# Patient Record
Sex: Male | Born: 1960 | Race: White | Hispanic: No | Marital: Married | Smoking: Never smoker
Health system: Southern US, Community
[De-identification: ages and names within clinical notes are randomized; demographics above are authoritative.]

## PROBLEM LIST (undated history)

## (undated) DIAGNOSIS — I517 Cardiomegaly: Secondary | ICD-10-CM

## (undated) DIAGNOSIS — E0590 Thyrotoxicosis, unspecified without thyrotoxic crisis or storm: Secondary | ICD-10-CM

## (undated) DIAGNOSIS — I422 Other hypertrophic cardiomyopathy: Secondary | ICD-10-CM

## (undated) DIAGNOSIS — I4819 Other persistent atrial fibrillation: Secondary | ICD-10-CM

## (undated) HISTORY — DX: Cardiomegaly: I51.7

## (undated) HISTORY — DX: Other hypertrophic cardiomyopathy: I42.2

## (undated) HISTORY — DX: Thyrotoxicosis, unspecified without thyrotoxic crisis or storm: E05.90

## (undated) HISTORY — DX: Other persistent atrial fibrillation: I48.19

---
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
## 2018-11-23 ENCOUNTER — Encounter (INDEPENDENT_AMBULATORY_CARE_PROVIDER_SITE_OTHER): Payer: Self-pay | Admitting: Ophthalmology

## 2018-11-23 ENCOUNTER — Ambulatory Visit (INDEPENDENT_AMBULATORY_CARE_PROVIDER_SITE_OTHER): Payer: Managed Care, Other (non HMO) | Admitting: Ophthalmology

## 2018-11-23 NOTE — Progress Notes (Signed)
[facility] - Clinic Note  11/23/2018     CHIEF COMPLAINT Patient presents for Eye Injury   HISTORY OF PRESENT ILLNESS: Ronald [name] is a 58 y.o. male who presents to the clinic today for:   HPI    Eye Injury    In left eye.  Type of trauma is direct.  Associated signs and symptoms include eye pain and redness.  Negative for vision loss, tearing, flashing lights, photophobia and blurred vision.  Pain was noted as 5/10.  Since onset it is stable.  I, the attending physician,  performed the HPI with the patient and updated documentation appropriately.          Comments    [facility]. Ptwas cutting shrubbery this morning and a limb poked left eye. Developed blood blister on lateral sclera. Reports mild eye pain, foreign body sensation. Denies vision change or blurry vision. Pt reports history of poor vision OS -- amblyopia.       Last edited by Rennis ChrisZamora, Alyissa Whidbee, MD on 11/23/2018 12:40 PM. (History)      Referring physician: No referring provider defined for this encounter.  HISTORICAL INFORMATION:   Selected notes from the MEDICAL RECORD NUMBER    CURRENT MEDICATIONS: No current outpatient medications on file. (Ophthalmic Drugs)   No current facility-administered medications for this visit.  (Ophthalmic Drugs)   No current outpatient medications on file. (Other)   No current facility-administered medications for this visit.  (Other)      REVIEW OF SYSTEMS: ROS    Negative for: Constitutional, Gastrointestinal, Neurological, Skin, Genitourinary, Musculoskeletal, HENT, Endocrine, Cardiovascular, Eyes, Respiratory, Psychiatric, Allergic/Imm, Heme/Lymph   Last edited by Rennis ChrisZamora, Elgie Maziarz, MD on 11/23/2018 12:26 PM. (History)       ALLERGIES Not on File  PAST MEDICAL HISTORY History reviewed. No pertinent past medical history. History reviewed. No pertinent surgical history.  FAMILY HISTORY History reviewed. No pertinent family  history.  SOCIAL HISTORY Social History   Tobacco Use  . Smoking status: Not on file  Substance Use Topics  . Alcohol use: Not on file  . Drug use: Not on file         OPHTHALMIC EXAM:  Base Eye Exam    Visual Acuity (Snellen - Linear)      Right Left   Dist cc 20/15 20/60 +1   Dist ph cc  20/50 +2   Correction: Glasses       Tonometry (Tonopen, 12:16 PM)      Right Left   Pressure 13 13       Pupils      Dark Light Shape React APD   Right 4 2 Round 2+ -   Left 4 2 Round 2+ -       Extraocular Movement      Right Left    Full, Ortho Full, Ortho       Neuro/Psych    Oriented x3: Yes   Mood/Affect: Normal       Dilation    Left eye: 1.0% Mydriacyl, 2.5% Phenylephrine @ 12:24 PM        Slit Lamp and Fundus Exam    Slit Lamp Exam      Right Left   Lids/Lashes Normal Normal   Conjunctiva/Sclera White and quiet subconj heme temporal   Cornea Clear Clear   Anterior Chamber Deep and quiet Deep and quiet   Iris Round and reactive Round and reactive   Lens 1+ NSC 1+ NSC  Vitreous Normal Normal       Fundus Exam      Right Left   Disc  compact, tilted   C/D Ratio  0.3   Macula  flat   Vessels  attenuated   Periphery  attached; no RT/RD          IMAGING AND PROCEDURES  Imaging and Procedures for 11/23/18           ASSESSMENT/PLAN:    ICD-10-CM   1. Subconjunctival hemorrhage of left eye  H11.32   2. Amblyopia of left eye  H53.002     1. [place] OS - trauma to OS while doing yard work, trimming shrubs -- limb poked OS under glasses - no debris in eye; no lacerations of conj or corneal abrasion - discussed findings, prognosis - no acute intervention indicated - advised ATs and lubricating ointment/gel as needed - f/u prn  2. Amblyopia OS - pt with long standing history of decreased - by history, likely refractive amblyopia   Ophthalmic Meds Ordered this visit:  No orders of the defined types were placed in this encounter.       Return if symptoms worsen or fail to improve.  There are no Patient Instructions on file for this visit.   Explained the diagnoses, plan, and follow up with the patient and they expressed understanding.  Patient expressed understanding of the importance of proper follow up care.   Gardiner Sleeper, M.D., Ph.D. Diseases & Surgery of the Retina and Vitreous [facility] 11/23/18     Abbreviations: M myopia (nearsighted); A astigmatism; H hyperopia (farsighted); P presbyopia; Mrx spectacle prescription;  CTL contact lenses; OD right eye; OS left eye; OU both eyes  XT exotropia; ET esotropia; PEK punctate epithelial keratitis; PEE punctate epithelial erosions; DES dry eye syndrome; MGD meibomian gland dysfunction; ATs artificial tears; PFAT's preservative free artificial tears; [place] nuclear sclerotic cataract; PSC posterior subcapsular cataract; ERM epi-retinal membrane; PVD posterior vitreous detachment; RD retinal detachment; DM diabetes mellitus; DR diabetic retinopathy; NPDR non-proliferative diabetic retinopathy; PDR proliferative diabetic retinopathy; CSME clinically significant macular edema; DME diabetic macular edema; dbh dot blot hemorrhages; CWS cotton wool spot; POAG primary open angle glaucoma; C/D cup-to-disc ratio; HVF humphrey visual field; GVF goldmann visual field; OCT optical coherence tomography; IOP intraocular pressure; BRVO Branch retinal vein occlusion; CRVO central retinal vein occlusion; CRAO central retinal artery occlusion; BRAO branch retinal artery occlusion; RT retinal tear; SB scleral buckle; PPV pars plana vitrectomy; VH Vitreous hemorrhage; PRP panretinal laser photocoagulation; IVK intravitreal kenalog; VMT vitreomacular traction; MH Macular hole;  NVD neovascularization of the disc; NVE neovascularization elsewhere; AREDS age related eye disease study; ARMD age related macular degeneration; POAG primary open angle glaucoma; EBMD  epithelial/anterior basement membrane dystrophy; ACIOL anterior chamber intraocular lens; IOL intraocular lens; PCIOL posterior chamber intraocular lens; Phaco/IOL phacoemulsification with intraocular lens placement; [place] photorefractive keratectomy; LASIK laser assisted in situ keratomileusis; HTN hypertension; DM diabetes mellitus; COPD chronic obstructive pulmonary disease

## 2019-03-27 ENCOUNTER — Encounter: Payer: Self-pay | Admitting: General Practice

## 2019-04-09 ENCOUNTER — Institutional Professional Consult (permissible substitution): Payer: Managed Care, Other (non HMO) | Admitting: Internal Medicine

## 2019-04-18 DIAGNOSIS — I4891 Unspecified atrial fibrillation: Secondary | ICD-10-CM | POA: Insufficient documentation

## 2019-04-22 ENCOUNTER — Ambulatory Visit (INDEPENDENT_AMBULATORY_CARE_PROVIDER_SITE_OTHER): Payer: Managed Care, Other (non HMO) | Admitting: Internal Medicine

## 2019-04-22 ENCOUNTER — Encounter: Payer: Self-pay | Admitting: Internal Medicine

## 2019-04-22 ENCOUNTER — Other Ambulatory Visit: Payer: Self-pay

## 2019-04-22 VITALS — BP 138/92 | HR 60 | Ht 74.0 in | Wt 218.6 lb

## 2019-04-22 DIAGNOSIS — I422 Other hypertrophic cardiomyopathy: Secondary | ICD-10-CM

## 2019-04-22 DIAGNOSIS — I4891 Unspecified atrial fibrillation: Secondary | ICD-10-CM

## 2019-04-22 NOTE — Progress Notes (Signed)
e   ELECTROPHYSIOLOGY CONSULT NOTE  Patient ID: Ronald [name], MRN: [id number], DOB/AGE: 10/08/1960 59 y.o. Admit date: (Not on file) Date of Consult: 04/22/2019  Primary Physician: System, Pcp Not In Primary Cardiologist: none -- [facility] Ronald [name] who is being seen today for the evaluation of Atrial fib at the request of jessica Livengood.   Chief Complaint: atrial fib   HPI Ronald [name] seen because of recurring atrial fibrillation.  Initially diagnosed with atrial fibrillation in 2012 in the setting of hyperthyroidism and underwent cardioversion 2013.  Recurrent atrial fibrillation 2019.  Treated with flecainide and converted spontaneously.  No recurrent atrial fibrillation January 2021 that lasted for couple of days and then terminated with self-administered elevated doses of flecainide.  Atrial fibrillation is associate with significant lassitude dyspnea with impaired exercise tolerance.  No lightheadedness.  Thromboembolic risk factors ( ) for a CHADSVASc Score of 0  Some sleep disordered breathing.  Strong family history of atrial fibrillation in his younger brother and sister, latter having undergone catheter ablation.  No family history of structural heart disease.  He has managed his Eliquis on his own taking it with recurrence of atrial fibrillation.  Otherwise takes high-dose aspirin on a regular basis  Outside office records reviewed   DATE TEST EF   6/19 (scanned)   Echo   64 % LAE (42 ml/ml2) LVH 15/ 9 mm          Date Cr K Hgb  1/21    15.3        \  Blood pressures as an outpatient in the 120/80 range    Past Medical History:  Diagnosis Date  . Asymmetric septal hypertrophy (HCC)   . Atrial fibrillation (HCC)   . Hyperthyroidism       Surgical History: History reviewed. No pertinent surgical history.   Home Meds:  Current Outpatient Medications:  .  aspirin 325 MG tablet, Take 1 tablet by  mouth daily., Disp: , Rfl:  .  flecainide (TAMBOCOR) 100 MG tablet, Take 100 mg by mouth 2 (two) times daily., Disp: , Rfl:  .  Omega-3 Fatty Acids (FISH OIL) 1000 MG CAPS, Take 1,200 mg by mouth daily., Disp: , Rfl:  .  Vitamins/Minerals TABS, Take 1 tablet by mouth daily., Disp: , Rfl:     Allergies: Not on File  Social History   Socioeconomic History  . Marital status: Married    Spouse name: Not on file  . Number of children: Not on file  . Years of education: Not on file  . Highest education level: Not on file  Occupational History  . Not on file  Tobacco Use  . Smoking status: Never Smoker  . Smokeless tobacco: Never Used  Substance and Sexual Activity  . Alcohol use: Not on file  . Drug use: Not on file  . Sexual activity: Not on file  Other Topics Concern  . Not on file  Social History Narrative  . Not on file   Social Determinants of Health   Financial Resource Strain:   . Difficulty of Paying Living Expenses: Not on file  Food Insecurity:   . Worried About Programme researcher, broadcasting/film/video in the Last Year: Not on file  . Ran Out of Food in the Last Year: Not on file  Transportation Needs:   . Lack of Transportation (Medical): Not on file  . Lack of Transportation (Non-Medical): Not on file  Physical Activity:   .  Days of Exercise per Week: Not on file  . Minutes of Exercise per Session: Not on file  Stress:   . Feeling of Stress : Not on file  Social Connections:   . Frequency of Communication with Friends and Family: Not on file  . Frequency of Social Gatherings with Friends and Family: Not on file  . Attends Religious Services: Not on file  . Active Member of Clubs or Organizations: Not on file  . Attends Archivist Meetings: Not on file  . Marital Status: Not on file  Intimate Partner Violence:   . Fear of Current or Ex-Partner: Not on file  . Emotionally Abused: Not on file  . Physically Abused: Not on file  . Sexually Abused: Not on file      History reviewed. No pertinent family history.   ROS:  Please see the history of present illness.     All other systems reviewed and negative.    Physical Exam: Blood pressure (!) 138/92, pulse 60, height 6\' 2"  (1.88 m), weight 218 lb 9.6 oz (99.2 kg), SpO2 98 %. General: Well developed, well nourished male in no acute distress. Head: Normocephalic, atraumatic, sclera non-icteric, no xanthomas, nares are without discharge. EENT: normal Lymph Nodes:  none Back: without scoliosis/kyphosis, no CVA tendersness Neck: Negative for carotid bruits. JVD not elevated. Lungs: Clear bilaterally to auscultation without wheezes, rales, or rhonchi. Breathing is unlabored. Heart: RRR with S1 S2. No  murmur , rubs, or gallops appreciated. Abdomen: Soft, non-tender, non-distended with normoactive bowel sounds. No hepatomegaly. No rebound/guarding. No obvious abdominal masses. Msk:  Strength and tone appear normal for age. Extremities: No clubbing or cyanosis. No edema.  Distal pedal pulses are 2+ and equal bilaterally. Skin: Warm and Dry Neuro: Alert and oriented X 3. CN III-XII intact Grossly normal sensory and motor function . Psych:  Responds to questions appropriately with a normal affect.      Labs: Cardiac Enzymes No results for input(s): CKTOTAL, CKMB, TROPONINI in the last 72 hours. CBC No results found for: WBC, HGB, HCT, MCV, PLT PROTIME: No results for input(s): LABPROT, INR in the last 72 hours. Chemistry No results for input(s): NA, K, CL, CO2, BUN, CREATININE, CALCIUM, PROT, BILITOT, ALKPHOS, ALT, AST, GLUCOSE in the last 168 hours.  Invalid input(s): LABALBU Lipids No results found for: CHOL, HDL, LDLCALC, TRIG BNP No results found for: PROBNP Thyroid Function Tests: No results for input(s): TSH, T4TOTAL, T3FREE, THYROIDAB in the last 72 hours.  Invalid input(s): FREET3    Miscellaneous No results found for: DDIMER  Radiology/Studies:  No results found.  EKG: Sinus at  60 Intervals 20/10/44   Assessment and Plan:  Atrial fibrillation-paroxysmal  Asymmetric septal hypertrophy?  HCM  Hyperthyroidism treated    The patient has atrial fibrillation and has infrequent for which he has been on flecainide for years.  He has tolerated it well.  He is taking aspirin for atrial fibrillation.  We have discussed the lack of utility for his thromboembolic risk reduction in atrial fibrillation.  We will stop it.  For now we will continue flecainide; however, his echo describes significant septal hypertrophy raising the possibility of hypertrophic cardiomyopathy.  In the event that he has significant hypertrophic heart disease, flecainide would be contraindicated.  Moreover, if this were the case, anticoagulation is indicated regardless of his CHA2DS2-VASc score  We have reviewed the above.  We have also reviewed the potential familial implications of HCM.  He has no children but he  has 2 siblings.  He has left atrial enlargement.  This would be expected if he has HCM.  He was obviously concerned that our review of the echo was different from his understanding that his echo was "normal ".    Sherryl Manges

## 2019-04-22 NOTE — Patient Instructions (Signed)
Medication Instructions:  Your physician recommends that you continue on your current medications as directed. Please refer to the Current Medication list given to you today.  *If you need a refill on your cardiac medications before your next appointment, please call your pharmacy*  Lab Work: None ordered.  If you have labs (blood work) drawn today and your tests are completely normal, you will receive your results only by: Marland Kitchen MyChart Message (if you have MyChart) OR . A paper copy in the mail If you have any lab test that is abnormal or we need to change your treatment, we will call you to review the results.  Testing/Procedures: Your physician has recommended that you have a sleep study. This test records several body functions during sleep, including: brain activity, eye movement, oxygen and carbon dioxide blood levels, heart rate and rhythm, breathing rate and rhythm, the flow of air through your mouth and nose, snoring, body muscle movements, and chest and belly movement.  Your physician has requested that you have a cardiac MRI. Cardiac MRI uses a computer to create images of your heart as its beating, producing both still and moving pictures of your heart and major blood vessels. For further information please visit [email or web address]. Please follow the instruction sheet given to you today for more information.    Follow-Up: At [facility], you and your health needs are our priority.  As part of our continuing mission to provide you with exceptional heart care, we have created designated Provider Care Teams.  These Care Teams include your primary Cardiologist (physician) and Advanced Practice Providers (APPs -  Physician Assistants and Nurse Practitioners) who all work together to provide you with the care you need, when you need it.  Your next appointment:   Your physician recommends that you schedule a follow-up appointment in:6 months with Dr Graciela Husbands   The format for your next  appointment:   In person  Provider:   Dr Sherryl Manges  Other Instructions  Nuclear Medicine Exam A nuclear medicine exam is a safe and painless imaging test. It helps your health care provider detect and diagnose diseases. It also provides information about the ways your organs work and how they are structured. For a nuclear medicine exam, you will be given a radioactive tracer. This substance is absorbed by your body's organs. A large scanning machine detects the tracer and creates pictures of the areas that your health care provider wants to know more about. There are several kinds of nuclear medicine exams. They include the following:  CT scan.  MRI scan.  PET scan.  SPECT scan. Tell your health care provider about:  Any allergies you have.  All medicines you are taking, including vitamins, herbs, eye drops, creams, and over-the-counter medicines.  Any problems you or family members have had with anesthetic medicines.  Any blood disorders you have.  Any surgeries you have had.  Any medical conditions you have.  Whether you are pregnant or may be pregnant.  Whether you are nursing. What are the risks? Generally, this is a safe procedure. However, problems may occur, such as an allergic reaction to the tracer, but this is rare. What happens before the procedure? Medicines Ask your health care provider about:  Changing or stopping your regular medicines. This is especially important if you are taking diabetes medicines or blood thinners.  Taking medicines such as aspirin and ibuprofen. These medicines can thin your blood. Do not take these medicines unless your health care provider tells you  to take them.  Taking over-the-counter medicines, vitamins, herbs, and supplements. General instructions  Follow instructions from your health care provider about eating or drinking restrictions.  Do not wear jewelry.  Wear loose, comfortable clothing. You may be asked to  wear a hospital gown for the procedure.  Bring previous imaging studies, such as X-rays, with you to the exam if they are available. What happens during the procedure?   An IV may be inserted into one of your veins.  You will be asked to lie on a table or sit in a chair.  You will be given the radioactive tracer. You may get: ? A pill or liquid to swallow. ? An injection. ? Medicine through your IV. ? A gas to inhale.  A large scanning machine will be used to create images of your body. After the pictures are taken, you may have to wait so your health care provider can make sure that enough images were taken. The procedure may vary among health care providers and hospitals. What happens after the procedure?  You may go home after the procedure and return to your usual activities, unless your health care provider tells you otherwise.  Drink enough water to keep your urine pale yellow. This helps to remove the radioactive tracer from your body.  It is up to you to get the results of your procedure. Ask your health care provider, or the department that is doing the procedure, when your results will be ready.  Get help right away if you have problems breathing. Summary  A nuclear medicine exam is a safe and painless imaging test that provides information about how your organs are working. It is also used to detect and diagnose diseases of various body organs.  Follow your health care provider's instructions about eating and drinking restrictions. Ask whether you should change or stop any medicines.  During the procedure, you will be given a radioactive tracer. A large scanning machine will create images of your body.  You may go home after the procedure and return to your regular activities. Follow your health care provider's instructions.  Get help right away if you have problems breathing. This information is not intended to replace advice given to you by your health care  provider. Make sure you discuss any questions you have with your health care provider. Document Revised: 01/09/2018 Document Reviewed: 01/09/2018 Elsevier Patient Education  2020 Elsevier Inc.   Sleep Studies A sleep study (polysomnogram) is a series of tests done while you are sleeping. A sleep study records your brain waves, heart rate, breathing rate, oxygen level, and eye and leg movements. A sleep study helps your health care provider:  See how well you sleep.  Diagnose a sleep disorder.  Determine how severe your sleep disorder is.  Create a plan to treat your sleep disorder. Your health care provider may recommend a sleep study if you:  Feel sleepy on most days.  Snore loudly while sleeping.  Have unusual behaviors while you sleep, such as walking.  Have brief periods in which you stop breathing during sleep (sleepapnea).  Fall asleep suddenly during the day (narcolepsy).  Have trouble falling asleep or staying asleep (insomnia).  Feel like you need to move your legs when trying to fall asleep (restless legs syndrome).  Move your legs by flexing and extending them regularly while asleep (periodic limb movement disorder).  Act out your dreams while you sleep (sleep behavior disorder).  Feel like you cannot move when you  first wake up (sleep paralysis). What tests are part of a sleep study? Most sleep studies record the following during sleep:  Brain activity.  Eye movements.  Heart rate and rhythm.  Breathing rate and rhythm.  Blood-oxygen level.  Blood pressure.  Chest and belly movement as you breathe.  Arm and leg movements.  Snoring or other noises.  Body position. Where are sleep studies done? Sleep studies are done at sleep centers. A sleep center may be inside a hospital, office, or clinic. The room where you have the study may look like a hospital room or a hotel room. The health care providers doing the study may come in and out of the room  during the study. Most of the time, they will be in another room monitoring your test as you sleep. How are sleep studies done? Most sleep studies are done during a normal period of time for a full night of sleep. You will arrive at the study center in the evening and go home in the morning. Before the test  Bring your pajamas and toothbrush with you to the sleep study.  Do not have caffeine on the day of your sleep study.  Do not drink alcohol on the day of your sleep study.  Your health care provider will let you know if you should stop taking any of your regular medicines before the test. During the test      Round, sticky patches with sensors attached to recording wires (electrodes) are placed on your scalp, face, chest, and limbs.  Wires from all the electrodes and sensors run from your bed to a computer. The wires can be taken off and put back on if you need to get out of bed to go to the bathroom.  A sensor is placed over your nose to measure airflow.  A finger clip is put on your finger or ear to measure your blood oxygen level (pulse oximetry).  A belt is placed around your belly and a belt is placed around your chest to measure breathing movements.  If you have signs of the sleep disorder called sleep apnea during your test, you may get a treatment mask to wear for the second half of the night. ? The mask provides positive airway pressure (PAP) to help you breathe better during sleep. This may greatly improve your sleep apnea. ? You will then have all tests done again with the mask in place to see if your measurements and recordings change. After the test  A medical doctor who specializes in sleep will evaluate the results of your sleep study and share them with you and your primary health care provider.  Based on your results, your medical history, and a physical exam, you may be diagnosed with a sleep disorder, such as: ? Sleep apnea. ? Restless legs  syndrome. ? Sleep-related behavior disorder. ? Sleep-related movement disorders. ? Sleep-related seizure disorders.  Your health care team will help determine your treatment options based on your diagnosis. This may include: ? Improving your sleep habits (sleep hygiene). ? Wearing a continuous positive airway pressure (CPAP) or bi-level positive airway pressure (BPAP) mask. ? Wearing an oral device at night to improve breathing and reduce snoring. ? Taking medicines. Follow these instructions at home:  Take over-the-counter and prescription medicines only as told by your health care provider.  If you are instructed to use a CPAP or BPAP mask, make sure you use it nightly as directed.  Make any lifestyle changes that  your health care provider recommends.  If you were given a device to open your airway while you sleep, use it only as told by your health care provider.  Do not use any tobacco products, such as cigarettes, chewing tobacco, and e-cigarettes. If you need help quitting, ask your health care provider.  Keep all follow-up visits as told by your health care provider. This is important. Summary  A sleep study (polysomnogram) is a series of tests done while you are sleeping. It shows how well you sleep.  Most sleep studies are done over one full night of sleep. You will arrive at the study center in the evening and go home in the morning.  If you have signs of the sleep disorder called sleep apnea during your test, you may get a treatment mask to wear for the second half of the night.  A medical doctor who specializes in sleep will evaluate the results of your sleep study and share them with your primary health care provider. This information is not intended to replace advice given to you by your health care provider. Make sure you discuss any questions you have with your health care provider. Document Revised: 08/07/2018 Document Reviewed: 03/20/2017 Elsevier Patient Education   [place].

## 2019-04-28 ENCOUNTER — Telehealth: Payer: Self-pay | Admitting: *Deleted

## 2019-04-28 NOTE — Telephone Encounter (Signed)
Staff message sent to Ronald [name] in lab sleep study denied by [place]. Patient does not meet the criteria for in lab study. Authorized HST. Auth # [id number]. Valid dates 04/28/19 to 07/27/19.

## 2019-04-29 ENCOUNTER — Telehealth: Payer: Self-pay | Admitting: *Deleted

## 2019-04-29 NOTE — Telephone Encounter (Signed)
-----   Message from Gaynelle Cage, CMA sent at 04/28/2019 11:24 AM EST ----- Regarding: RE: Sleep study [employer] denied in lab studies. Does not meet criteria. Approved HST. Auth # [id number]. Valid dates 04/28/19 to 07/27/19. ----- Message ----- From: Alois Cliche, RN Sent: 04/22/2019   4:38 PM EST To: [facility] Sleep Studies Subject: Sleep study                                    Please precert and schedule pt for sleep study  Thank you,  Mindi Junker

## 2019-05-05 ENCOUNTER — Institutional Professional Consult (permissible substitution): Payer: Managed Care, Other (non HMO) | Admitting: Internal Medicine

## 2019-05-09 ENCOUNTER — Ambulatory Visit: Payer: Managed Care, Other (non HMO) | Attending: Internal Medicine

## 2019-05-09 DIAGNOSIS — Z23 Encounter for immunization: Secondary | ICD-10-CM

## 2019-05-09 NOTE — Progress Notes (Signed)
   Covid-19 Vaccination Clinic  Name:  Obadiah Dennard    MRN: [id number] DOB: 10-15-1960  05/09/2019  Mr. Skillman was observed post Covid-19 immunization for 15 minutes without incident. He was provided with Vaccine Information Sheet and instruction to access the V-Safe system.   Mr. Pitsenbarger was instructed to call 911 with any severe reactions post vaccine: Marland Kitchen Difficulty breathing  . Swelling of face and throat  . A fast heartbeat  . A bad rash all over body  . Dizziness and weakness

## 2019-05-10 ENCOUNTER — Other Ambulatory Visit (HOSPITAL_COMMUNITY): Payer: Managed Care, Other (non HMO)

## 2019-05-12 NOTE — Telephone Encounter (Signed)
Patient called to cancel on 05/02/19.

## 2019-05-13 ENCOUNTER — Encounter (HOSPITAL_BASED_OUTPATIENT_CLINIC_OR_DEPARTMENT_OTHER): Payer: Managed Care, Other (non HMO) | Admitting: Cardiology

## 2019-05-15 ENCOUNTER — Encounter: Payer: Self-pay | Admitting: Internal Medicine

## 2019-05-15 ENCOUNTER — Telehealth: Payer: Self-pay | Admitting: Internal Medicine

## 2019-05-15 NOTE — Telephone Encounter (Signed)
Left message regarding appointment for Cardiac MRI scheduled Friday 06/06/19 at 9: 00 am at [facility]---arrival time is 8:15 am 1st floor radiology department.  Will mail informatoin to patient.

## 2019-06-04 ENCOUNTER — Ambulatory Visit: Payer: Managed Care, Other (non HMO) | Attending: Internal Medicine

## 2019-06-04 DIAGNOSIS — Z23 Encounter for immunization: Secondary | ICD-10-CM

## 2019-06-04 NOTE — Progress Notes (Signed)
   Covid-19 Vaccination Clinic  Name:  Ronald [name]    MRN: [id number] DOB: [birth year]  06/04/2019  [name] was observed post Covid-19 immunization for 15 minutes without incident. He was provided with Vaccine Information Sheet and instruction to access the V-Safe system.   [name] was instructed to call 911 with any severe reactions post vaccine: Marland Kitchen Difficulty breathing  . Swelling of face and throat  . A fast heartbeat  . A bad rash all over body  . Dizziness and weakness   Immunizations Administered    Name Date Dose VIS Date Route   Pfizer COVID-19 Vaccine 06/04/2019 12:40 PM 0.3 mL 02/14/2019 Intramuscular   Manufacturer: [employer], [employer]   Lot: [id number]   NDC: [address]-3

## 2019-06-06 ENCOUNTER — Ambulatory Visit (HOSPITAL_COMMUNITY)
Admission: RE | Admit: 2019-06-06 | Discharge: 2019-06-06 | Disposition: A | Discharge status: Home / Self Care | Payer: Managed Care, Other (non HMO) | Source: Ambulatory Visit | Attending: Internal Medicine | Admitting: Internal Medicine

## 2019-06-06 ENCOUNTER — Other Ambulatory Visit: Payer: Self-pay

## 2019-06-06 DIAGNOSIS — I4891 Unspecified atrial fibrillation: Secondary | ICD-10-CM | POA: Diagnosis not present

## 2019-06-06 DIAGNOSIS — I422 Other hypertrophic cardiomyopathy: Secondary | ICD-10-CM | POA: Diagnosis not present

## 2019-06-06 MED ORDER — GADOBUTROL 1 MMOL/ML IV SOLN
10.0000 mL | Freq: Once | INTRAVENOUS | Status: AC | PRN
  Administered 2019-06-06: 11:00:00 10 mL via INTRAVENOUS

## 2019-06-19 ENCOUNTER — Telehealth: Payer: Self-pay

## 2019-06-19 DIAGNOSIS — I4891 Unspecified atrial fibrillation: Secondary | ICD-10-CM

## 2019-06-19 DIAGNOSIS — I422 Other hypertrophic cardiomyopathy: Secondary | ICD-10-CM

## 2019-06-19 NOTE — Telephone Encounter (Signed)
-----   Message from Shona Simpson, RN sent at 06/18/2019  4:20 PM EDT ----- See Dr. Odessa Fleming result note - does not appear pt has been contacted for results - once pt has been notified of results let the afib clinic know and we will be happy to schedule follow up to start anticoagulation. Thanks! Stacy RN AFib Clinic

## 2019-06-19 NOTE — Telephone Encounter (Signed)
Phone call to pt and reviewed results with pt. (See also result note)  Advised per Dr Graciela Husbands he will need to stop ASA and Flecainide.  Referred to AFIB clinic to start anticoag therapy.  Discussed possibility of ablation with pt.  Pt verbalizes understanding and is agreeable with current plan.

## 2019-06-20 ENCOUNTER — Other Ambulatory Visit: Payer: Self-pay

## 2019-06-20 ENCOUNTER — Ambulatory Visit (HOSPITAL_COMMUNITY)
Admission: RE | Admit: 2019-06-20 | Discharge: 2019-06-20 | Disposition: A | Discharge status: Home / Self Care | Payer: Managed Care, Other (non HMO) | Source: Ambulatory Visit | Attending: Physician Assistant | Admitting: Physician Assistant

## 2019-06-20 ENCOUNTER — Encounter (HOSPITAL_COMMUNITY): Payer: Self-pay | Admitting: Physician Assistant

## 2019-06-20 VITALS — BP 146/82 | HR 68 | Ht 74.0 in | Wt 217.2 lb

## 2019-06-20 DIAGNOSIS — D6869 Other thrombophilia: Secondary | ICD-10-CM | POA: Diagnosis not present

## 2019-06-20 DIAGNOSIS — Z881 Allergy status to other antibiotic agents status: Secondary | ICD-10-CM | POA: Insufficient documentation

## 2019-06-20 DIAGNOSIS — I4891 Unspecified atrial fibrillation: Secondary | ICD-10-CM | POA: Diagnosis present

## 2019-06-20 DIAGNOSIS — I422 Other hypertrophic cardiomyopathy: Secondary | ICD-10-CM | POA: Diagnosis not present

## 2019-06-20 DIAGNOSIS — I480 Paroxysmal atrial fibrillation: Secondary | ICD-10-CM

## 2019-06-20 LAB — BASIC METABOLIC PANEL
Anion gap: 8 (ref 5–15)
BUN: 13 mg/dL (ref 6–20)
CO2: 28 mmol/L (ref 22–32)
Calcium: 9.5 mg/dL (ref 8.9–10.3)
Chloride: 102 mmol/L (ref 98–111)
Creatinine, Ser: 1 mg/dL (ref 0.61–1.24)
GFR calc Af Amer: >60 mL/min (ref >60)
GFR calc non Af Amer: >60 mL/min (ref >60)
Glucose, Bld: 133 mg/dL — ABNORMAL HIGH (ref 70–99)
Potassium: 4 mmol/L (ref 3.5–5.1)
Sodium: 138 mmol/L (ref 135–145)

## 2019-06-20 LAB — CBC
HCT: 47 % (ref 39.0–52.0)
Hemoglobin: 15.9 g/dL (ref 13.0–17.0)
MCH: 30.8 pg (ref 26.0–34.0)
MCHC: 33.8 g/dL (ref 30.0–36.0)
MCV: 90.9 fL (ref 80.0–100.0)
Platelets: 196 10*3/uL (ref 150–400)
RBC: 5.17 MIL/uL (ref 4.22–5.81)
RDW: 12.3 % (ref 11.5–15.5)
WBC: 3.3 10*3/uL — ABNORMAL LOW (ref 4.0–10.5)
nRBC: 0 % (ref 0.0–0.2)

## 2019-06-20 MED ORDER — APIXABAN 5 MG PO TABS: 5.0000 mg | ORAL_TABLET | Freq: Two times a day (BID) | ORAL | 3 refills | Status: DC

## 2019-06-20 NOTE — Progress Notes (Signed)
Primary Care Physician: System, Pcp Not In Primary Electrophysiologist: Dr Graciela Husbands Referring Physician: Dr Rowe Pavy is a 59 y.o. male with a history of paroxysmal atrial fibrillation and HCM who presents for follow up in the [facility]. The patient was initially diagnosed with atrial fibrillation in 2012 in the setting of hyperthyroidism and underwent DCCV in 2013. Patient is not on anticoagulation for a CHADS2VASC score of 0. He has used flecainide with good success but this was d/c'd with his diagnosis of HCM. Patient reports he feels very well overall and has been very active for many years. He is anxious about his new diagnosis. His mother died suddenly from unknown causes. He denies significant alcohol use but does admit to snoring and apnea. His father, sister, and brother all have had a diagnosis of afib.   Today, he denies symptoms of palpitations, chest pain, shortness of breath, orthopnea, PND, lower extremity edema, dizziness, presyncope, syncope, snoring, daytime somnolence, bleeding, or neurologic sequela. The patient is tolerating medications without difficulties and is otherwise without complaint today.    Atrial Fibrillation Risk Factors:  he does have symptoms or diagnosis of sleep apnea. Sleep study pending. he does not have a history of rheumatic fever. he does not have a history of alcohol use. The patient does have a history of early familial atrial fibrillation or other arrhythmias.  he has a BMI of Body mass index is 27.89 kg/m.Marland Kitchen Filed Weights   06/20/19 0956  Weight: 98.5 kg    No family history on file.   Atrial Fibrillation Management history:  Previous antiarrhythmic drugs: flecainide Previous cardioversions: 2013 Previous ablations: none CHADS2VASC score: 0 Anticoagulation history: none   Past Medical History:  Diagnosis Date  . Asymmetric septal hypertrophy (HCC)   . Atrial fibrillation (HCC)   .  Hyperthyroidism    No past surgical history on file.  Current Outpatient Medications  Medication Sig Dispense Refill  . Omega-3 Fatty Acids (FISH OIL) 1000 MG CAPS Take 1,200 mg by mouth daily.    . Vitamins/Minerals TABS Take 1 tablet by mouth daily.    Marland Kitchen apixaban (ELIQUIS) 5 MG TABS tablet Take 1 tablet (5 mg total) by mouth 2 (two) times daily. 60 tablet 3   No current facility-administered medications for this encounter.    Allergies  Allergen Reactions  . Ciprofloxacin Other (See Comments)    dizziness    Social History   Socioeconomic History  . Marital status: Married    Spouse name: Not on file  . Number of children: Not on file  . Years of education: Not on file  . Highest education level: Not on file  Occupational History  . Not on file  Tobacco Use  . Smoking status: Never Smoker  . Smokeless tobacco: Never Used  Substance and Sexual Activity  . Alcohol use: Not on file  . Drug use: Never  . Sexual activity: Not on file  Other Topics Concern  . Not on file  Social History Narrative  . Not on file   Social Determinants of Health   Financial Resource Strain:   . Difficulty of Paying Living Expenses:   Food Insecurity:   . Worried About Programme researcher, broadcasting/film/video in the Last Year:   . Barista in the Last Year:   Transportation Needs:   . Freight forwarder (Medical):   Marland Kitchen Lack of Transportation (Non-Medical):   Physical Activity:   . Days of  Exercise per Week:   . Minutes of Exercise per Session:   Stress:   . Feeling of Stress :   Social Connections:   . Frequency of Communication with Friends and Family:   . Frequency of Social Gatherings with Friends and Family:   . Attends Religious Services:   . Active Member of Clubs or Organizations:   . Attends Archivist Meetings:   Marland Kitchen Marital Status:   Intimate Partner Violence:   . Fear of Current or Ex-Partner:   . Emotionally Abused:   Marland Kitchen Physically Abused:   . Sexually Abused:       ROS- All systems are reviewed and negative except as per the HPI above.  Physical Exam: Vitals:   06/20/19 0956  BP: (!) 146/82  Pulse: 68  Weight: 98.5 kg  Height: 6\' 2"  (1.88 m)    GEN- The patient is well appearing, alert and oriented x 3 today.   Head- normocephalic, atraumatic Eyes-  Sclera clear, conjunctiva pink Ears- hearing intact Oropharynx- clear Neck- supple  Lungs- Clear to ausculation bilaterally, normal work of breathing Heart- Regular rate and rhythm, no murmurs, rubs or gallops  GI- soft, NT, ND, + BS Extremities- no clubbing, cyanosis, or edema MS- no significant deformity or atrophy Skin- no rash or lesion Psych- euthymic mood, full affect Neuro- strength and sensation are intact  Wt Readings from Last 3 Encounters:  06/20/19 98.5 kg  04/22/19 99.2 kg    EKG today demonstrates SR HR 68, PR 182, QRS 88, QTc 431  Cardiac MRI 06/06/19 IMPRESSION: 1. Mild asymmetric hypertrophy in basal anteroseptum measuring 10mm (72mm in posterior wall), not meeting criteria for HCM (<15 mm). However, there is focal moderate hypertrophy in the mid inferoseptum measuring up to 70mm, which does meet criteria for HCM  2. RV insertion site LGE, which can be seen in HCM. LGE accounts for <1% of total myocardial mass  3.  Mild LV dilatation with normal systolic function (EF 14%)  4.  Normal RV size and systolic function (EF 43%)  Epic records are reviewed at length today  CHA2DS2-VASc Score = 0 The patient's score is based upon: CHF History: No HTN History: No Age : < 65 Diabetes History: No Stroke History: No Vascular Disease History: No Gender: Male      ASSESSMENT AND PLAN: 1. Paroxysmal Atrial Fibrillation (ICD10:  I48.0) The patient's CHA2DS2-VASc score is 0   General education about afib provided and questions answered. We also discussed his stroke risk in the setting of HCM and the risks and benefits of anticoagulation.  Start Eliquis 5 mg BID.  Check Bmet/CBC today. Will not plan for AAD for now. If his afib becomes more persistent, can consider ablation vs alternate AAD. Patient in agreement with plan.  2. Secondary hypercoagulable state Patient has a CHADS2VASC score of 0, however, with his h/o HCM anticoagulation is indicated.   3. Suspected obstructive sleep apnea The importance of adequate treatment of sleep apnea was discussed today in order to improve our ability to maintain sinus rhythm long term. Sleep study pending.  4. HCM Mild basal and moderate mid interseptum hypertrophy on cardiac MRI. Normal LV systolic function.     Follow up with Dr Caryl Comes in one month.    [facility] [address] [place], [place] [address] [phone] 06/20/2019 11:15 AM

## 2019-06-20 NOTE — Patient Instructions (Signed)
Start Eliquis 5mg  twice a day  Scheduling will contact you for appointment with Dr. in 1 month

## 2019-07-01 ENCOUNTER — Other Ambulatory Visit: Payer: Self-pay

## 2019-07-01 ENCOUNTER — Encounter (HOSPITAL_COMMUNITY): Payer: Self-pay | Admitting: Physician Assistant

## 2019-07-01 ENCOUNTER — Ambulatory Visit (HOSPITAL_COMMUNITY)
Admission: RE | Admit: 2019-07-01 | Discharge: 2019-07-01 | Disposition: A | Discharge status: Home / Self Care | Payer: Managed Care, Other (non HMO) | Source: Ambulatory Visit | Attending: Physician Assistant | Admitting: Physician Assistant

## 2019-07-01 VITALS — BP 122/84 | HR 109 | Ht 74.0 in | Wt 215.8 lb

## 2019-07-01 DIAGNOSIS — I422 Other hypertrophic cardiomyopathy: Secondary | ICD-10-CM | POA: Diagnosis not present

## 2019-07-01 DIAGNOSIS — D6869 Other thrombophilia: Secondary | ICD-10-CM | POA: Diagnosis not present

## 2019-07-01 DIAGNOSIS — R0681 Apnea, not elsewhere classified: Secondary | ICD-10-CM | POA: Diagnosis not present

## 2019-07-01 DIAGNOSIS — Z7901 Long term (current) use of anticoagulants: Secondary | ICD-10-CM | POA: Insufficient documentation

## 2019-07-01 DIAGNOSIS — Z79899 Other long term (current) drug therapy: Secondary | ICD-10-CM | POA: Diagnosis not present

## 2019-07-01 DIAGNOSIS — Z881 Allergy status to other antibiotic agents status: Secondary | ICD-10-CM | POA: Insufficient documentation

## 2019-07-01 DIAGNOSIS — I4891 Unspecified atrial fibrillation: Secondary | ICD-10-CM | POA: Diagnosis present

## 2019-07-01 DIAGNOSIS — I480 Paroxysmal atrial fibrillation: Secondary | ICD-10-CM | POA: Diagnosis not present

## 2019-07-01 DIAGNOSIS — R0683 Snoring: Secondary | ICD-10-CM | POA: Insufficient documentation

## 2019-07-01 MED ORDER — DILTIAZEM HCL ER COATED BEADS 120 MG PO CP24: 120.0000 mg | ORAL_CAPSULE | Freq: Every day | ORAL | 3 refills | Status: DC

## 2019-07-01 NOTE — Progress Notes (Signed)
Primary Care Physician: System, Pcp Not In Primary Electrophysiologist: Dr Graciela Husbands Referring Physician: Dr Rowe Pavy is a 59 y.o. male with a history of paroxysmal atrial fibrillation and HCM who presents for follow up in the [facility]. The patient was initially diagnosed with atrial fibrillation in 2012 in the setting of hyperthyroidism and underwent DCCV in 2013. Patient is not on anticoagulation for a CHADS2VASC score of 0. He has used flecainide with good success but this was d/c'd with his diagnosis of HCM. Patient reports he feels very well overall and has been very active for many years. He is anxious about his new diagnosis. His mother died suddenly from unknown causes. He denies significant alcohol use but does admit to snoring and apnea. His father, sister, and brother all have had a diagnosis of afib.   On follow up today, patient reports he felt he was back in afib the evening of 06/30/19. There were no triggers he could identify. He has symptoms of fatigue and palpitations. He is tolerating anticoagulation without and bleeding issues.   Today, he denies symptoms of chest pain, shortness of breath, orthopnea, PND, lower extremity edema, dizziness, presyncope, syncope, bleeding, or neurologic sequela. The patient is tolerating medications without difficulties and is otherwise without complaint today.    Atrial Fibrillation Risk Factors:  he does have symptoms or diagnosis of sleep apnea. Sleep study pending. he does not have a history of rheumatic fever. he does not have a history of alcohol use. The patient does have a history of early familial atrial fibrillation or other arrhythmias.  he has a BMI of Body mass index is 27.71 kg/m.Marland Kitchen Filed Weights   07/01/19 1449  Weight: 97.9 kg    No family history on file.   Atrial Fibrillation Management history:  Previous antiarrhythmic drugs: flecainide Previous cardioversions:  2013 Previous ablations: none CHADS2VASC score: 0 Anticoagulation history: Eliquis   Past Medical History:  Diagnosis Date  . Asymmetric septal hypertrophy (HCC)   . Atrial fibrillation (HCC)   . Hyperthyroidism    No past surgical history on file.  Current Outpatient Medications  Medication Sig Dispense Refill  . apixaban (ELIQUIS) 5 MG TABS tablet Take 1 tablet (5 mg total) by mouth 2 (two) times daily. 60 tablet 3  . Omega-3 Fatty Acids (FISH OIL) 1000 MG CAPS Take 1,200 mg by mouth daily.    . Vitamins/Minerals TABS Take 1 tablet by mouth daily.    Marland Kitchen diltiazem (CARDIZEM CD) 120 MG 24 hr capsule Take 1 capsule (120 mg total) by mouth daily. 30 capsule 3   No current facility-administered medications for this encounter.    Allergies  Allergen Reactions  . Ciprofloxacin Other (See Comments)    dizziness    Social History   Socioeconomic History  . Marital status: Married    Spouse name: Not on file  . Number of children: Not on file  . Years of education: Not on file  . Highest education level: Not on file  Occupational History  . Not on file  Tobacco Use  . Smoking status: Never Smoker  . Smokeless tobacco: Never Used  Substance and Sexual Activity  . Alcohol use: Yes    Alcohol/week: 2.0 standard drinks    Types: 2 Cans of beer per week  . Drug use: Never  . Sexual activity: Not on file  Other Topics Concern  . Not on file  Social History Narrative  . Not on file  Social Determinants of Health   Financial Resource Strain:   . Difficulty of Paying Living Expenses:   Food Insecurity:   . Worried About Charity fundraiser in the Last Year:   . Arboriculturist in the Last Year:   Transportation Needs:   . Film/video editor (Medical):   Marland Kitchen Lack of Transportation (Non-Medical):   Physical Activity:   . Days of Exercise per Week:   . Minutes of Exercise per Session:   Stress:   . Feeling of Stress :   Social Connections:   . Frequency of  Communication with Friends and Family:   . Frequency of Social Gatherings with Friends and Family:   . Attends Religious Services:   . Active Member of Clubs or Organizations:   . Attends Archivist Meetings:   Marland Kitchen Marital Status:   Intimate Partner Violence:   . Fear of Current or Ex-Partner:   . Emotionally Abused:   Marland Kitchen Physically Abused:   . Sexually Abused:      ROS- All systems are reviewed and negative except as per the HPI above.  Physical Exam: Vitals:   07/01/19 1449  BP: 122/84  Pulse: (!) 109  Weight: 97.9 kg  Height: 6\' 2"  (1.88 m)    GEN- The patient is well appearing, alert and oriented x 3 today.   HEENT-head normocephalic, atraumatic, sclera clear, conjunctiva pink, hearing intact, trachea midline. Lungs- Clear to ausculation bilaterally, normal work of breathing Heart- irregular rate and rhythm, no murmurs, rubs or gallops  GI- soft, NT, ND, + BS Extremities- no clubbing, cyanosis, or edema MS- no significant deformity or atrophy Skin- no rash or lesion Psych- euthymic mood, full affect Neuro- strength and sensation are intact   Wt Readings from Last 3 Encounters:  07/01/19 97.9 kg  06/20/19 98.5 kg  04/22/19 99.2 kg    EKG today demonstrates afib HR 109, QRS 82, QTc 406  Cardiac MRI 06/06/19 IMPRESSION: 1. Mild asymmetric hypertrophy in basal anteroseptum measuring 50mm (32mm in posterior wall), not meeting criteria for HCM (<15 mm). However, there is focal moderate hypertrophy in the mid inferoseptum measuring up to 49mm, which does meet criteria for HCM  2. RV insertion site LGE, which can be seen in HCM. LGE accounts for <1% of total myocardial mass  3.  Mild LV dilatation with normal systolic function (EF 85%)  4.  Normal RV size and systolic function (EF 46%)  Epic records are reviewed at length today   CHA2DS2-VASc Score = 0 The patient's score is based upon: CHF History: No HTN History: No Age : < 65 Diabetes History:  No Stroke History: No Vascular Disease History: No Gender: Male   ASSESSMENT AND PLAN: 1. Paroxysmal Atrial Fibrillation (ICD10:  I48.0) The patient's CHA2DS2-VASc score is 0 Patient is back in afib today. We discussed therapeutic options today including ablation vs dofetilide admission. Patient would like to take time to consider these two options.   Continue Eliquis 5 mg BID. Will plan to start diltiazem 120 mg daily for rate control. He had significant fatigue side effects with BB in the past.    2. Secondary hypercoagulable state Patient has a CHADS2VASC score of 0, however, with his h/o HCM anticoagulation is indicated.   3. Suspected obstructive sleep apnea Sleep study pending.  4. HCM Mild basal and moderate mid interseptum hypertrophy on cardiac MRI. Normal LV systolic function.   Follow up pending patient's decision regarding rhythm strategy. Patient to call  clinic. Keep current f/u with Dr Graciela Husbands for now.   Jorja Loa PA-C Af[facility] Clinic [facility] [address] [place], [place] [address] [phone] 07/01/2019 4:09 PM

## 2019-07-04 ENCOUNTER — Telehealth (INDEPENDENT_AMBULATORY_CARE_PROVIDER_SITE_OTHER): Payer: Managed Care, Other (non HMO) | Admitting: Internal Medicine

## 2019-07-04 ENCOUNTER — Encounter: Payer: Self-pay | Admitting: Internal Medicine

## 2019-07-04 ENCOUNTER — Telehealth: Payer: Self-pay | Admitting: Internal Medicine

## 2019-07-04 ENCOUNTER — Other Ambulatory Visit: Payer: Self-pay

## 2019-07-04 ENCOUNTER — Telehealth: Payer: Self-pay

## 2019-07-04 VITALS — BP 130/88 | HR 102 | Ht 74.0 in | Wt 215.8 lb

## 2019-07-04 DIAGNOSIS — I4819 Other persistent atrial fibrillation: Secondary | ICD-10-CM

## 2019-07-04 DIAGNOSIS — D6869 Other thrombophilia: Secondary | ICD-10-CM | POA: Diagnosis not present

## 2019-07-04 DIAGNOSIS — I4891 Unspecified atrial fibrillation: Secondary | ICD-10-CM

## 2019-07-04 NOTE — Telephone Encounter (Signed)
Patient states he just spoke with Dr. Johney Frame about scheduling an ablation for next week.  He would like to speak to [place] before she schedules anything.  As he will not be on the Eliquis for long enough to have the ablation done as early as next week.

## 2019-07-04 NOTE — Telephone Encounter (Signed)
-----   Message from Hillis Range, MD sent at 07/04/2019  3:42 PM EDT ----- Afib CIA  Cardiac CT vs TEE

## 2019-07-04 NOTE — Telephone Encounter (Signed)
Returned call to Pt.  After further discussion, Pt would like to schedule ablation after he has been anticoagulated for 3 weeks.  Will schedule ablation for August 28, 2019 (Pt has vacation planned for first weeks of June)

## 2019-07-04 NOTE — Progress Notes (Signed)
Electrophysiology TeleHealth Note   Due to national recommendations of social distancing due to [place] 19, Audio/video telehealth visit is felt to be most appropriate for this patient at this time.  See MyChart message from today for patient consent regarding telehealth for [facility].   Date:  07/04/2019   ID:  Ronald [name], DOB 30-Dec-1960, MRN [id number]  Location: home Provider location: [place] Evaluation Performed: New patient consult  PCP:  System, Pcp Not In   Electrophysiologist:  Dr Caryl Comes  Chief Complaint:  afib  History of Present Illness:    Ronald [name] who presents via audio/video conferencing for a telehealth visit today.   The patient is referred for new consultation regarding afib by Dr Caryl Comes and Adline Peals. He has a h/o HCM.  He reports initially having afib in 2012.  He reports that this occurred in the setting of hyperthyroidism/ Graves disease.  He underwent cardioversion in 2013.  He id well thereafter.  He did well until he had afib recurrence in 2019.  He began using flecainide as a "pill in pocket" approach.  He did well until 03/2019 when he had afib which self terminated.  He was evaluated 04/22/19 by Dr Caryl Comes (His note reviewed).  The patient was subsequently found to have hypertrophy on echo.  This was confirmed with cardiac MRI.  Dr Gardiner Rhyme felt that the hypertrophic septum was not consistent with HCM. His flecainide was discontinued.  He has had afib recurrence since that time.  He is persistently in afib at this time.  He is taking diltiazem for rate control.  He has been in afib for the past 4 days.  He has been anticoagulated with eliquis at least 2 weeks.   He reports feeling "horrible" in afib.  His exercise tolerance has declined substantially. Today, he denies symptoms of palpitations, chest pain, shortness of breath, orthopnea, PND, lower extremity edema, claudication, dizziness, presyncope, syncope, bleeding, or  neurologic sequela. The patient is tolerating medications without difficulties and is otherwise without complaint today.     Past Medical History:  Diagnosis Date  . Asymmetric septal hypertrophy (HCC)   . Hyperthyroidism   . Persistent atrial fibrillation [facility])     Past Surgical History:  Procedure Laterality Date  . INGUINAL HERNIA REPAIR  1969    Current Outpatient Medications  Medication Sig Dispense Refill  . apixaban (ELIQUIS) 5 MG TABS tablet Take 1 tablet (5 mg total) by mouth 2 (two) times daily. 60 tablet 3  . diltiazem (CARDIZEM CD) 120 MG 24 hr capsule Take 1 capsule (120 mg total) by mouth daily. 30 capsule 3  . Omega-3 Fatty Acids (FISH OIL) 1000 MG CAPS Take 1,200 mg by mouth daily.    . Vitamins/Minerals TABS Take 1 tablet by mouth daily.     No current facility-administered medications for this visit.    Allergies:   Ciprofloxacin   Social History:  The patient  reports that he has never smoked. He has never used smokeless tobacco. He reports current alcohol use of about 2.0 standard drinks of alcohol per week. He reports that he does not use drugs.   Family History:  The patient's family history includes Atrial fibrillation in his brother, father, and sister.    ROS:  Please see the history of present illness.   All other systems are personally reviewed and negative.    Exam:    Vital Signs:  BP 130/88   Pulse (!) 102  Ht 6\' 2"  (1.88 m)   Wt 215 lb 12.8 oz (97.9 kg)   BMI 27.71 kg/m    Well appearing, alert and conversant, regular work of breathing,  good skin color Eyes- anicteric, neuro- grossly intact, skin- no apparent rash or lesions or cyanosis, mouth- oral mucosa is pink   Labs/Other Tests and Data Reviewed:    Recent Labs: 06/20/2019: BUN 13; Creatinine, Ser 1.00; Hemoglobin 15.9; Platelets 196; Potassium 4.0; Sodium 138   Wt Readings from Last 3 Encounters:  07/04/19 215 lb 12.8 oz (97.9 kg)  07/01/19 215 lb 12.8 oz (97.9 kg)    06/20/19 217 lb 3.2 oz (98.5 kg)     Other studies personally reviewed: Additional studies/ records that were reviewed today include: echo, MRI, Dr 06/22/19 notes,  AF clinic notes  Review of the above records today demonstrates: as above   ASSESSMENT & PLAN:    1.  Persistent atrial fibrillation The patient has symptomatic, recurrent persistent atrial fibrillation. he has failed medical therapy with flecainide.  Given his LV thickness, I agree with Dr Koren Bound that this medicine is not a good option for him  he is anticoagulated with eliquis . Therapeutic strategies for afib including medicine (norpace, tikosyn, amiodarone) and ablation were discussed in detail with the patient today. Risk, benefits, and alternatives to EP study and radiofrequency ablation for afib were also discussed in detail today. These risks include but are not limited to stroke, bleeding, vascular damage, tamponade, perforation, damage to the esophagus, lungs, and other structures, pulmonary vein stenosis, worsening renal function, and death. The patient understands these risk and wishes to proceed.  We will therefore proceed with catheter ablation at the next available time.  Carto, ICE, anesthesia are requested for the procedure.  Will also obtain cardiac CT or TEE (depending on cardiac CT availability) prior to the procedure to exclude LAA thrombus and further evaluate atrial anatomy.    Patient Risk:  after full review of this patients clinical status, I feel that they are at moderate risk at this time.   Today, I have spent 20 minutes with the patient with telehealth technology discussing afib .    Signed, Graciela Husbands MD, [facility] 07/04/2019 3:25 PM   [facility] [address] [place] [phone] (office) [phone] (fax)

## 2019-07-07 MED ORDER — METOPROLOL TARTRATE 100 MG PO TABS: ORAL_TABLET | ORAL | 0 refills | Status: DC

## 2019-07-07 NOTE — Telephone Encounter (Signed)
Pt would like to move up ablation to June 10.  Rescheduled all pre procedure testing and updated instruction letter.

## 2019-07-07 NOTE — Telephone Encounter (Signed)
Patient is calling to follow up in regards to procedure. He states he is requesting to reschedule ablation currently scheduled for 08/28/19. Please call.

## 2019-07-07 NOTE — Telephone Encounter (Signed)
Pt scheduled for afib ablation on June 24  Covid/labs scheduled  Instruction letters mailed to Pt  Work up complete

## 2019-07-16 ENCOUNTER — Other Ambulatory Visit: Payer: Self-pay

## 2019-07-16 ENCOUNTER — Other Ambulatory Visit: Payer: Managed Care, Other (non HMO) | Admitting: *Deleted

## 2019-07-16 DIAGNOSIS — I4819 Other persistent atrial fibrillation: Secondary | ICD-10-CM

## 2019-07-16 DIAGNOSIS — I4891 Unspecified atrial fibrillation: Secondary | ICD-10-CM

## 2019-07-16 LAB — BASIC METABOLIC PANEL
BUN/Creatinine Ratio: 11 (ref 9–20)
BUN: 11 mg/dL (ref 6–24)
CO2: 31 mmol/L — ABNORMAL HIGH (ref 20–29)
Calcium: 9.5 mg/dL (ref 8.7–10.2)
Chloride: 100 mmol/L (ref 96–106)
Creatinine, Ser: 1.02 mg/dL (ref 0.76–1.27)
GFR calc Af Amer: 95 mL/min/{1.73_m2} (ref >59)
GFR calc non Af Amer: 82 mL/min/{1.73_m2} (ref >59)
Glucose: 114 mg/dL — ABNORMAL HIGH (ref 65–99)
Potassium: 4.1 mmol/L (ref 3.5–5.2)
Sodium: 140 mmol/L (ref 134–144)

## 2019-07-16 LAB — CBC WITH DIFFERENTIAL/PLATELET
Basophils Absolute: 0 10*3/uL (ref 0.0–0.2)
Basos: 0 %
EOS (ABSOLUTE): 0 10*3/uL (ref 0.0–0.4)
Eos: 1 %
Hematocrit: 44.8 % (ref 37.5–51.0)
Hemoglobin: 15.5 g/dL (ref 13.0–17.7)
Lymphocytes Absolute: 0.8 10*3/uL (ref 0.7–3.1)
Lymphs: 19 %
MCH: 31.3 pg (ref 26.6–33.0)
MCHC: 34.6 g/dL (ref 31.5–35.7)
MCV: 90 fL (ref 79–97)
Monocytes Absolute: 0.3 10*3/uL (ref 0.1–0.9)
Monocytes: 7 %
Neutrophils Absolute: 3 10*3/uL (ref 1.4–7.0)
Neutrophils: 73 %
Platelets: 165 10*3/uL (ref 150–450)
RBC: 4.96 x10E6/uL (ref 4.14–5.80)
RDW: 13.5 % (ref 11.6–15.4)
WBC: 4.1 10*3/uL (ref 3.4–10.8)

## 2019-07-29 ENCOUNTER — Ambulatory Visit: Payer: Managed Care, Other (non HMO) | Admitting: Internal Medicine

## 2019-08-11 ENCOUNTER — Telehealth (HOSPITAL_COMMUNITY): Payer: Self-pay | Admitting: *Deleted

## 2019-08-11 NOTE — Telephone Encounter (Signed)
Attempted to call patient regarding upcoming cardiac CT appointment. Left message on voicemail with name and callback number  [name] Tai RN Navigator Cardiac Imaging [facility] [phone] Office [phone] Cell  

## 2019-08-12 ENCOUNTER — Other Ambulatory Visit (HOSPITAL_COMMUNITY)
Admission: RE | Admit: 2019-08-12 | Discharge: 2019-08-12 | Disposition: A | Discharge status: Home / Self Care | Payer: Managed Care, Other (non HMO) | Source: Ambulatory Visit | Attending: Internal Medicine | Admitting: Internal Medicine

## 2019-08-12 ENCOUNTER — Other Ambulatory Visit: Payer: Self-pay

## 2019-08-12 ENCOUNTER — Ambulatory Visit (HOSPITAL_COMMUNITY)
Admission: RE | Admit: 2019-08-12 | Discharge: 2019-08-12 | Disposition: A | Discharge status: Home / Self Care | Payer: Managed Care, Other (non HMO) | Source: Ambulatory Visit | Attending: Internal Medicine | Admitting: Internal Medicine

## 2019-08-12 DIAGNOSIS — Z20822 Contact with and (suspected) exposure to covid-19: Secondary | ICD-10-CM | POA: Diagnosis not present

## 2019-08-12 DIAGNOSIS — I4891 Unspecified atrial fibrillation: Secondary | ICD-10-CM | POA: Diagnosis not present

## 2019-08-12 LAB — SARS CORONAVIRUS 2 (TAT 6-24 HRS): SARS Coronavirus 2: NEGATIVE

## 2019-08-12 MED ORDER — IOHEXOL 350 MG/ML SOLN
80.0000 mL | Freq: Once | INTRAVENOUS | Status: AC | PRN
  Administered 2019-08-12: 80 mL via INTRAVENOUS

## 2019-08-13 ENCOUNTER — Other Ambulatory Visit: Payer: Managed Care, Other (non HMO)

## 2019-08-14 ENCOUNTER — Encounter (HOSPITAL_COMMUNITY)
Admission: RE | Disposition: A | Discharge status: Home / Self Care | Payer: Managed Care, Other (non HMO) | Source: Home / Self Care | Attending: Internal Medicine

## 2019-08-14 ENCOUNTER — Encounter (HOSPITAL_COMMUNITY): Payer: Self-pay | Admitting: Internal Medicine

## 2019-08-14 ENCOUNTER — Ambulatory Visit (HOSPITAL_COMMUNITY): Payer: Managed Care, Other (non HMO) | Admitting: Certified Registered Nurse Anesthetist

## 2019-08-14 ENCOUNTER — Other Ambulatory Visit: Payer: Self-pay

## 2019-08-14 ENCOUNTER — Ambulatory Visit (HOSPITAL_COMMUNITY)
Admission: RE | Admit: 2019-08-14 | Discharge: 2019-08-14 | Disposition: A | Discharge status: Home / Self Care | Payer: Managed Care, Other (non HMO) | Attending: Internal Medicine | Admitting: Internal Medicine

## 2019-08-14 DIAGNOSIS — Z7901 Long term (current) use of anticoagulants: Secondary | ICD-10-CM | POA: Diagnosis not present

## 2019-08-14 DIAGNOSIS — I4819 Other persistent atrial fibrillation: Secondary | ICD-10-CM | POA: Insufficient documentation

## 2019-08-14 DIAGNOSIS — Z79899 Other long term (current) drug therapy: Secondary | ICD-10-CM | POA: Insufficient documentation

## 2019-08-14 DIAGNOSIS — Z881 Allergy status to other antibiotic agents status: Secondary | ICD-10-CM | POA: Diagnosis not present

## 2019-08-14 HISTORY — PX: ATRIAL FIBRILLATION ABLATION: EP1191

## 2019-08-14 SURGERY — ATRIAL FIBRILLATION ABLATION
Anesthesia: General

## 2019-08-14 MED ORDER — PROPOFOL 10 MG/ML IV BOLUS
INTRAVENOUS | Status: DC | PRN
  Administered 2019-08-14: 140 mg via INTRAVENOUS

## 2019-08-14 MED ORDER — ONDANSETRON HCL 4 MG/2ML IJ SOLN
INTRAMUSCULAR | Status: DC | PRN
  Administered 2019-08-14: 4 mg via INTRAVENOUS

## 2019-08-14 MED ORDER — PHENYLEPHRINE HCL-NACL 10-0.9 MG/250ML-% IV SOLN
INTRAVENOUS | Status: DC | PRN
  Administered 2019-08-14: 25 ug/min via INTRAVENOUS

## 2019-08-14 MED ORDER — ROCURONIUM BROMIDE 10 MG/ML (PF) SYRINGE
PREFILLED_SYRINGE | INTRAVENOUS | Status: DC | PRN
  Administered 2019-08-14 (×2): 50 mg via INTRAVENOUS

## 2019-08-14 MED ORDER — SODIUM CHLORIDE 0.9 % IV SOLN: 250.0000 mL | INTRAVENOUS | Status: DC | PRN

## 2019-08-14 MED ORDER — PANTOPRAZOLE SODIUM 40 MG PO TBEC
40.0000 mg | DELAYED_RELEASE_TABLET | Freq: Every day | ORAL | 0 refills | Status: DC
Start: 2019-08-14 — End: 2019-08-29

## 2019-08-14 MED ORDER — APIXABAN 5 MG PO TABS
5.0000 mg | ORAL_TABLET | Freq: Two times a day (BID) | ORAL | Status: DC
  Administered 2019-08-14: 5 mg via ORAL
  Filled 2019-08-14: qty 1

## 2019-08-14 MED ORDER — SUGAMMADEX SODIUM 200 MG/2ML IV SOLN
INTRAVENOUS | Status: DC | PRN
  Administered 2019-08-14: 200 mg via INTRAVENOUS

## 2019-08-14 MED ORDER — SODIUM CHLORIDE 0.9 % IV SOLN: INTRAVENOUS | Status: DC

## 2019-08-14 MED ORDER — FENTANYL CITRATE (PF) 250 MCG/5ML IJ SOLN
INTRAMUSCULAR | Status: DC | PRN
  Administered 2019-08-14 (×2): 50 ug via INTRAVENOUS

## 2019-08-14 MED ORDER — MIDAZOLAM HCL 2 MG/2ML IJ SOLN
INTRAMUSCULAR | Status: DC | PRN
  Administered 2019-08-14: 2 mg via INTRAVENOUS

## 2019-08-14 MED ORDER — HYDROCODONE-ACETAMINOPHEN 5-325 MG PO TABS: 1.0000 | ORAL_TABLET | ORAL | Status: DC | PRN

## 2019-08-14 MED ORDER — HEPARIN SODIUM (PORCINE) 1000 UNIT/ML IJ SOLN
INTRAMUSCULAR | Status: DC | PRN
Start: 2019-08-14 — End: 2019-08-14
  Administered 2019-08-14 (×2): 5000 [IU] via INTRAVENOUS

## 2019-08-14 MED ORDER — ONDANSETRON HCL 4 MG/2ML IJ SOLN
4.0000 mg | Freq: Four times a day (QID) | INTRAMUSCULAR | Status: DC | PRN
Start: 2019-08-14 — End: 2019-08-14

## 2019-08-14 MED ORDER — ACETAMINOPHEN 325 MG PO TABS
650.0000 mg | ORAL_TABLET | ORAL | Status: DC | PRN
Start: 2019-08-14 — End: 2019-08-14

## 2019-08-14 MED ORDER — SODIUM CHLORIDE 0.9% FLUSH: 3.0000 mL | Freq: Two times a day (BID) | INTRAVENOUS | Status: DC

## 2019-08-14 MED ORDER — ISOPROTERENOL HCL 0.2 MG/ML IJ SOLN
INTRAMUSCULAR | Status: AC
  Filled 2019-08-14: qty 5

## 2019-08-14 MED ORDER — PROTAMINE SULFATE 10 MG/ML IV SOLN
INTRAVENOUS | Status: DC | PRN
  Administered 2019-08-14: 40 mg via INTRAVENOUS

## 2019-08-14 MED ORDER — HEPARIN SODIUM (PORCINE) 1000 UNIT/ML IJ SOLN
INTRAMUSCULAR | Status: DC | PRN
  Administered 2019-08-14: 1000 [IU] via INTRAVENOUS
  Administered 2019-08-14: 14000 [IU] via INTRAVENOUS

## 2019-08-14 MED ORDER — HEPARIN (PORCINE) IN NACL 1000-0.9 UT/500ML-% IV SOLN
INTRAVENOUS | Status: DC | PRN
  Administered 2019-08-14: 500 mL

## 2019-08-14 MED ORDER — SODIUM CHLORIDE 0.9% FLUSH: 3.0000 mL | INTRAVENOUS | Status: DC | PRN

## 2019-08-14 MED ORDER — HEPARIN SODIUM (PORCINE) 1000 UNIT/ML IJ SOLN
INTRAMUSCULAR | Status: AC
  Filled 2019-08-14: qty 2

## 2019-08-14 MED ORDER — LIDOCAINE 2% (20 MG/ML) 5 ML SYRINGE
INTRAMUSCULAR | Status: DC | PRN
  Administered 2019-08-14: 40 mg via INTRAVENOUS

## 2019-08-14 SURGICAL SUPPLY — 18 items
BLANKET WARM UNDERBOD FULL ACC (MISCELLANEOUS) ×2 IMPLANT
CATH 8FR REPROCESSED SOUNDSTAR (CATHETERS) ×2 IMPLANT
CATH MAPPNG PENTARAY F 2-6-2MM (CATHETERS) ×1 IMPLANT
CATH SMTCH THERMOCOOL SF DF (CATHETERS) ×2 IMPLANT
CATH WEBSTER BI DIR CS D-F CRV (CATHETERS) ×2 IMPLANT
COVER SWIFTLINK CONNECTOR (BAG) ×2 IMPLANT
DEVICE CLOSURE PERCLS PRGLD 6F (VASCULAR PRODUCTS) ×3 IMPLANT
NEEDLE BAYLIS TRANSSEPTAL 71CM (NEEDLE) ×2 IMPLANT
PACK EP LATEX FREE (CUSTOM PROCEDURE TRAY) ×1
PACK EP LF (CUSTOM PROCEDURE TRAY) ×1 IMPLANT
PAD PRO RADIOLUCENT 2001M-C (PAD) ×2 IMPLANT
PATCH CARTO3 (PAD) ×2 IMPLANT
PENTARAY F 2-6-2MM (CATHETERS) ×2
PERCLOSE PROGLIDE 6F (VASCULAR PRODUCTS) ×6
SHEATH PINNACLE 7F 10CM (SHEATH) ×4 IMPLANT
SHEATH PINNACLE 9F 10CM (SHEATH) ×2 IMPLANT
SHEATH SWARTZ TS SL2 63CM 8.5F (SHEATH) ×2 IMPLANT
TUBING SMART ABLATE COOLFLOW (TUBING) ×2 IMPLANT

## 2019-08-14 NOTE — Anesthesia Preprocedure Evaluation (Addendum)
Anesthesia Evaluation  Patient identified by MRN, date of birth, ID band Patient awake    Reviewed: Allergy & Precautions, NPO status , Patient's Chart, lab work & pertinent test results  Airway Mallampati: I  TM Distance: >3 FB Neck ROM: Full    Dental  (+) Teeth Intact, Dental Advisory Given   Pulmonary neg pulmonary ROS,    breath sounds clear to auscultation       Cardiovascular hypertension, Pt. on home beta blockers and Pt. on medications + dysrhythmias Atrial Fibrillation  Rhythm:Irregular Rate:Normal     Neuro/Psych negative neurological ROS  negative psych ROS   GI/Hepatic negative GI ROS, Neg liver ROS,   Endo/Other  Hyperthyroidism   Renal/GU negative Renal ROS     Musculoskeletal negative musculoskeletal ROS (+)   Abdominal Normal abdominal exam  (+)   Peds  Hematology negative hematology ROS (+)   Anesthesia Other Findings   Reproductive/Obstetrics                            Anesthesia Physical Anesthesia Plan  ASA: II  Anesthesia Plan: General   Post-op Pain Management:    Induction: Intravenous  PONV Risk Score and Plan: 3 and Ondansetron, Dexamethasone and Midazolam  Airway Management Planned: Oral ETT  Additional Equipment: None  Intra-op Plan:   Post-operative Plan: Extubation in OR  Informed Consent: I have reviewed the patients History and Physical, chart, labs and discussed the procedure including the risks, benefits and alternatives for the proposed anesthesia with the patient or authorized representative who has indicated his/her understanding and acceptance.       Plan Discussed with: CRNA  Anesthesia Plan Comments:        Anesthesia Quick Evaluation

## 2019-08-14 NOTE — Transfer of Care (Signed)
Immediate Anesthesia Transfer of Care Note  Patient: Ronald [name]  Procedure(s) Performed: ATRIAL FIBRILLATION ABLATION (N/A )  Patient Location: Cath Lab  Anesthesia Type:General  Level of Consciousness: awake, alert  and oriented  Airway & Oxygen Therapy: Patient Spontanous Breathing and Patient connected to nasal cannula oxygen  Post-op Assessment: Report given to RN and Post -op Vital signs reviewed and stable  Post vital signs: Reviewed and stable  Last Vitals:  Vitals Value Taken Time  BP 109/72 08/14/19 1408  Temp    Pulse 92 08/14/19 1410  Resp 12 08/14/19 1410  SpO2 98 % 08/14/19 1410  Vitals shown include unvalidated device data.  Last Pain: There were no vitals filed for this visit.       Complications: No complications documented.

## 2019-08-14 NOTE — H&P (Signed)
Chief Complaint:  afib  History of Present Illness:    Ronald [name] who presents today for afib  He has a h/o HCM.  He reports initially having afib in 2012.  He reports that this occurred in the setting of hyperthyroidism/ Graves disease.  He underwent cardioversion in 2013.  He did well thereafter.  He did well until he had afib recurrence in 2019.  He began using flecainide as a "pill in pocket" approach.  He did well until 03/2019 when he had afib which self terminated.  He was evaluated 04/22/19 by Dr Caryl Comes (His note reviewed).  The patient was subsequently found to have hypertrophy on echo.  This was confirmed with cardiac MRI.  Dr Gardiner Rhyme felt that the hypertrophic septum was not consistent with HCM. His flecainide was discontinued.  He has had afib recurrence since that time.  He is persistently in afib at this time.  He is taking diltiazem for rate control.  He has been in afib for the past 4 days.  He has been anticoagulated with eliquis at least 2 weeks.   He reports feeling "horrible" in afib.  His exercise tolerance has declined substantially. Today, he denies symptoms of palpitations, chest pain, shortness of breath, orthopnea, PND, lower extremity edema, claudication, dizziness, presyncope, syncope, bleeding, or neurologic sequela. The patient is tolerating medications without difficulties and is otherwise without complaint today.         Past Medical History:  Diagnosis Date   Asymmetric septal hypertrophy (HCC)    Hyperthyroidism    Persistent atrial fibrillation (HCC)          Past Surgical History:  Procedure Laterality Date   INGUINAL HERNIA REPAIR  1969          Current Outpatient Medications  Medication Sig Dispense Refill   apixaban (ELIQUIS) 5 MG TABS tablet Take 1 tablet (5 mg total) by mouth 2 (two) times daily. 60 tablet 3   diltiazem (CARDIZEM CD) 120 MG 24 hr capsule Take 1 capsule (120 mg total) by mouth daily. 30  capsule 3   Omega-3 Fatty Acids (FISH OIL) 1000 MG CAPS Take 1,200 mg by mouth daily.     Vitamins/Minerals TABS Take 1 tablet by mouth daily.     No current facility-administered medications for this visit.    Allergies:   Ciprofloxacin   Social History:  The patient  reports that he has never smoked. He has never used smokeless tobacco. He reports current alcohol use of about 2.0 standard drinks of alcohol per week. He reports that he does not use drugs.   Family History:  The patient's family history includes Atrial fibrillation in his brother, father, and sister.    ROS:  Please see the history of present illness.   All other systems are personally reviewed and negative.   Physical Exam: Vitals:   08/14/19 0812  BP: (!) 126/98  Pulse: (!) 126  Resp: 18  Temp: 97.7 F (36.5 C)  SpO2: 98%  Weight: 93.9 kg  Height: 6\' 2"  (1.88 m)    GEN- The patient is well appearing, alert and oriented x 3 today.   Head- normocephalic, atraumatic Eyes-  Sclera clear, conjunctiva pink Ears- hearing intact Oropharynx- clear Neck- supple, Lungs  normal work of breathing Heart- ittegular rate and rhythm  GI- soft  Extremities- no clubbing, cyanosis, or edema, groin is without hematoma/ bruit     Labs/Other Tests and Data Reviewed:    Recent Labs:  06/20/2019: BUN 13; Creatinine, Ser 1.00; Hemoglobin 15.9; Platelets 196; Potassium 4.0; Sodium 138      Wt Readings from Last 3 Encounters:  07/04/19 215 lb 12.8 oz (97.9 kg)  07/01/19 215 lb 12.8 oz (97.9 kg)  06/20/19 217 lb 3.2 oz (98.5 kg)     Other studies personally reviewed: Additional studies/ records that were reviewed today include: echo, MRI, Dr Koren Bound notes,  AF clinic notes  Review of the above records today demonstrates: as above   ASSESSMENT & PLAN:    1.  Persistent atrial fibrillation The patient has symptomatic, recurrent persistent atrial fibrillation. he has failed medical therapy with  flecainide.  Given his LV thickness, I agree with Dr Graciela Husbands that this medicine is not a good option for him  he is anticoagulated with eliquis . Therapeutic strategies for afib including medicine (norpace, tikosyn, amiodarone) and ablation were discussed in detail with the patient today.  Risk, benefits, and alternatives to EP study and radiofrequency ablation for afib were also discussed in detail today. These risks include but are not limited to stroke, bleeding, vascular damage, tamponade, perforation, damage to the esophagus, lungs, and other structures, pulmonary vein stenosis, worsening renal function, and death. The patient understands these risk and wishes to proceed.    He reports compliance with eliquis without interruption.  Today we discussed results of his cardiac CT.  We also discussed the "likely bone island vs lytic lesion" seen by radiology. We will check PSA today  Hillis Range MD, [facility] 08/14/2019 10:41 AM

## 2019-08-14 NOTE — Anesthesia Postprocedure Evaluation (Signed)
Anesthesia Post Note  Patient: Ronald [name]  Procedure(s) Performed: ATRIAL FIBRILLATION ABLATION (N/A )     Patient location during evaluation: PACU Anesthesia Type: General Level of consciousness: awake and alert Pain management: pain level controlled Vital Signs Assessment: post-procedure vital signs reviewed and stable Respiratory status: spontaneous breathing, nonlabored ventilation, respiratory function stable and patient connected to nasal cannula oxygen Cardiovascular status: blood pressure returned to baseline and stable Postop Assessment: no apparent nausea or vomiting Anesthetic complications: no   No complications documented.  Last Vitals:  Vitals:   08/14/19 1455 08/14/19 1510  BP: 108/66 112/76  Pulse: 84 84  Resp: 13 18  Temp:    SpO2: 98% 96%    Last Pain:  Vitals:   08/14/19 1433  TempSrc: Temporal  PainSc: 0-No pain                 Shelton Silvas

## 2019-08-14 NOTE — Progress Notes (Signed)
Patient and wife was given discharge instructions. Both verbalized understanding. 

## 2019-08-14 NOTE — Anesthesia Procedure Notes (Signed)
Procedure Name: Intubation Date/Time: 08/14/2019 11:15 AM Performed by: Valda Favia, CRNA Pre-anesthesia Checklist: Patient identified, Emergency Drugs available, Suction available and Patient being monitored Patient Re-evaluated:Patient Re-evaluated prior to induction Oxygen Delivery Method: Circle System Utilized Preoxygenation: Pre-oxygenation with 100% oxygen Induction Type: IV induction Ventilation: Mask ventilation without difficulty Laryngoscope Size: Mac and 4 Grade View: Grade I Tube type: Oral Tube size: 7.5 mm Number of attempts: 1 Airway Equipment and Method: Stylet and Oral airway Placement Confirmation: ETT inserted through vocal cords under direct vision,  positive ETCO2 and breath sounds checked- equal and bilateral Secured at: 23 cm Tube secured with: Tape Dental Injury: Teeth and Oropharynx as per pre-operative assessment

## 2019-08-14 NOTE — Discharge Instructions (Signed)
Femoral Site Care This sheet gives you information about how to care for yourself after your procedure. Your health care provider may also give you more specific instructions. If you have problems or questions, contact your health care provider. What can I expect after the procedure? After the procedure, it is common to have:  Bruising that usually fades within 1-2 weeks.  Tenderness at the site. Follow these instructions at home: Wound care  Follow instructions from your health care provider about how to take care of your insertion site. Make sure you: ? Wash your hands with soap and water before you change your bandage (dressing). If soap and water are not available, use hand sanitizer. ? Change your dressing as told by your health care provider. ? Leave stitches (sutures), skin glue, or adhesive strips in place. These skin closures may need to stay in place for 2 weeks or longer. If adhesive strip edges start to loosen and curl up, you may trim the loose edges. Do not remove adhesive strips completely unless your health care provider tells you to do that.  Do not take baths, swim, or use a hot tub until your health care provider approves.  You may shower 24-48 hours after the procedure or as told by your health care provider. ? Gently wash the site with plain soap and water. ? Pat the area dry with a clean towel. ? Do not rub the site. This may cause bleeding.  Do not apply powder or lotion to the site. Keep the site clean and dry.  Check your femoral site every day for signs of infection. Check for: ? Redness, swelling, or pain. ? Fluid or blood. ? Warmth. ? Pus or a bad smell. Activity  For the first 2-3 days after your procedure, or as long as directed: ? Avoid climbing stairs as much as possible. ? Do not squat.  Do not lift anything that is heavier than 10 lb (4.5 kg), or the limit that you are told, until your health care provider says that it is safe.  Rest as  directed. ? Avoid sitting for a long time without moving. Get up to take short walks every 1-2 hours.  Do not drive for 24 hours if you were given a medicine to help you relax (sedative). General instructions  Take over-the-counter and prescription medicines only as told by your health care provider.  Keep all follow-up visits as told by your health care provider. This is important. Contact a health care provider if you have:  A fever or chills.  You have redness, swelling, or pain around your insertion site. Get help right away if:  The catheter insertion area swells very fast.  You pass out.  You suddenly start to sweat or your skin gets clammy.  The catheter insertion area is bleeding, and the bleeding does not stop when you hold steady pressure on the area.  The area near or just beyond the catheter insertion site becomes pale, cool, tingly, or numb. These symptoms may represent a serious problem that is an emergency. Do not wait to see if the symptoms will go away. Get medical help right away. Call your local emergency services (911 in the [place] Do not drive yourself to the hospital. Summary  After the procedure, it is common to have bruising that usually fades within 1-2 weeks.  Check your femoral site every day for signs of infection.  Do not lift anything that is heavier than 10 lb (4.5 kg), or the   limit that you are told, until your health care provider says that it is safe. This information is not intended to replace advice given to you by your health care provider. Make sure you discuss any questions you have with your health care provider. Document Revised: 03/05/2017 Document Reviewed: 03/05/2017 Elsevier Patient Education  2020 [employer]  

## 2019-08-15 ENCOUNTER — Encounter (HOSPITAL_COMMUNITY): Payer: Self-pay | Admitting: Internal Medicine

## 2019-08-15 LAB — PSA, TOTAL AND FREE
PSA, Free Pct: 30 %
PSA, Free: 0.27 ng/mL
Prostate Specific Ag, Serum: 0.9 ng/mL (ref 0.0–4.0)

## 2019-08-15 LAB — POCT ACTIVATED CLOTTING TIME
Activated Clotting Time: 224 seconds
Activated Clotting Time: 252 seconds
Activated Clotting Time: 296 seconds

## 2019-08-25 ENCOUNTER — Other Ambulatory Visit (HOSPITAL_COMMUNITY): Payer: Managed Care, Other (non HMO)

## 2019-08-28 NOTE — H&P (View-Only) (Signed)
Primary Care Physician: Ronald [name], Provider, Ronald [name] Primary Electrophysiologist: Ronald [name] Referring Physician: Dr Rowe [name] with a history of paroxysmal atrial fibrillation and HCM who presents for follow up in the [facility]. The patient was initially diagnosed with atrial fibrillation in 2012 in the setting of hyperthyroidism and underwent DCCV in 2013. Patient is not on anticoagulation for a CHADS2VASC score of 0. He has used flecainide with good success but this was d/c'd with his diagnosis of HCM. Patient reports he feels very well overall and has been very active for many years. He is anxious about his new diagnosis. His mother died suddenly from unknown causes. He denies significant alcohol use but does admit to snoring and apnea. His father, sister, and brother all have had a diagnosis of afib. Patient reports he felt he was back in afib the evening of 06/30/19. There were no triggers he could identify.   On follow up today, patient is s/p afib ablation on 08/14/19. Unfortunately,  he felt he was back in afib on 08/26/19 with symptoms of heart racing and fatigue. He resumed his diltiazem which did help alleviate his symptoms. There were no triggers that he could identify. He denies CP, swallowing, or groin issues.   Today, he denies symptoms of chest pain, shortness of breath, orthopnea, PND, lower extremity edema, dizziness, presyncope, syncope, bleeding, or neurologic sequela. The patient is tolerating medications without difficulties and is otherwise without complaint today.    Atrial Fibrillation Risk Factors:  he does have symptoms or diagnosis of sleep apnea. Sleep study pending. he does not have a history of rheumatic fever. he does not have a history of alcohol use. The patient does have a history of early familial atrial fibrillation or other arrhythmias.  he has a BMI of Body mass index is 27.35 kg/m.Marland Kitchen Filed Weights    08/29/19 0837  Weight: 96.6 kg    Family History  Problem Relation Age of Onset  . Atrial fibrillation Father   . Atrial fibrillation Brother   . Atrial fibrillation Sister      Atrial Fibrillation Management history:  Previous antiarrhythmic drugs: flecainide Previous cardioversions: 2013 Previous ablations: 08/14/19 CHADS2VASC score: 0 Anticoagulation history: Eliquis   Past Medical History:  Diagnosis Date  . Asymmetric septal hypertrophy (HCC)   . Hyperthyroidism   . Persistent atrial fibrillation [facility])    Past Surgical History:  Procedure Laterality Date  . ATRIAL FIBRILLATION ABLATION N/A 08/14/2019   Procedure: ATRIAL FIBRILLATION ABLATION;  Surgeon: Ronald [name], Ronald [name];  Location: [facility] INVASIVE CV LAB;  Service: Cardiovascular;  Laterality: N/A;  . INGUINAL HERNIA REPAIR  1969    Current Outpatient Medications  Medication Sig Dispense Refill  . apixaban (ELIQUIS) 5 MG TABS tablet Take 1 tablet (5 mg total) by mouth 2 (two) times daily. 60 tablet 3  . diltiazem (CARDIZEM CD) 120 MG 24 hr capsule Take 1 capsule (120 mg total) by mouth in the morning and at bedtime. 30 capsule 3   No current facility-administered medications for this encounter.    Allergies  Allergen Reactions  . Ciprofloxacin Other (See Comments)    dizziness    Social History   Socioeconomic History  . Marital status: Married    Spouse name: Not on file  . Number of children: Not on file  . Years of education: Not on file  . Highest education level: Not on file  Occupational History  . Not on  file  Tobacco Use  . Smoking status: Never Smoker  . Smokeless tobacco: Never Used  Substance and Sexual Activity  . Alcohol use: Yes    Alcohol/week: 2.0 standard drinks    Types: 2 Cans of beer per week  . Drug use: Never  . Sexual activity: Not on file  Other Topics Concern  . Not on file  Social History Narrative   Lives in [place] with wife.   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   Social Determinants of Health   Financial Resource Strain:   . Difficulty of Paying Living Expenses:   Food Insecurity:   . Worried About Charity fundraiser in the Last Year:   . Arboriculturist in the Last Year:   Transportation Needs:   . Film/video editor (Medical):   Marland Kitchen Lack of Transportation (Non-Medical):   Physical Activity:   . Days of Exercise per Week:   . Minutes of Exercise per Session:   Stress:   . Feeling of Stress :   Social Connections:   . Frequency of Communication with Friends and Family:   . Frequency of Social Gatherings with Friends and Family:   . Attends Religious Services:   . Active Member of Clubs or Organizations:   . Attends Archivist Meetings:   Marland Kitchen Marital Status:   Intimate Partner Violence:   . Fear of Current or Ex-Partner:   . Emotionally Abused:   Marland Kitchen Physically Abused:   . Sexually Abused:      ROS- All systems are reviewed and negative except as per the HPI above.  Physical Exam: Vitals:   08/29/19 0837  BP: 140/90  Pulse: (!) 116  Weight: 96.6 kg  Height: 6\' 2"  (1.88 m)    GEN- The patient is well appearing, alert and oriented x 3 today.   HEENT-head normocephalic, atraumatic, sclera clear, conjunctiva pink, hearing intact, trachea midline. Lungs- Clear to ausculation bilaterally, normal work of breathing Heart- irregular rate and rhythm, tachycardia, no murmurs, rubs or gallops  GI- soft, NT, ND, + BS Extremities- no clubbing, cyanosis, or edema MS- no significant deformity or atrophy Skin- no rash or lesion Psych- euthymic mood, full affect Neuro- strength and sensation are intact   Wt Readings from Last 3 Encounters:  08/29/19 96.6 kg  08/14/19 93.9 kg  07/04/19 97.9 kg    EKG today demonstrates afib HR 116, QRS 80, QTc 403  Cardiac MRI 06/06/19 IMPRESSION: 1. Mild asymmetric hypertrophy in basal anteroseptum measuring 13mm (10mm in posterior wall), not meeting  criteria for HCM (<15 mm). However, there is focal moderate hypertrophy in the mid inferoseptum measuring up to 48mm, which does meet criteria for HCM  2. RV insertion site LGE, which can be seen in HCM. LGE accounts for <1% of total myocardial mass  3.  Mild LV dilatation with normal systolic function (EF 32%)  4.  Normal RV size and systolic function (EF 99%)  Epic records are reviewed at length today   CHA2DS2-VASc Score = 0 The patient's score is based upon: CHF History: No HTN History: No Age : < 65 Diabetes History: No Stroke History: No Vascular Disease History: No Gender: Male   ASSESSMENT AND PLAN: 1. Persistent Atrial Fibrillation (ICD10:  I48.0) The patient's CHA2DS2-VASc score is 0 S/p afib ablation with Ronald Rayann Heman 08/14/19. Reassured patient that some afib is not unusual for the first 3 months post ablation.  Increase diltiazem  to 120 mg BID for rate control. Decrease back to daily dosing after DCCV. Will arrange for DCCV. Check bmet/CBC. Patient denies any missed doses of anticoagulation.  Continue Eliquis 5 mg BID.  2. Secondary hypercoagulable state Patient has a CHADS2VASC score of 0, however, with his h/o HCM anticoagulation is indicated.   3. Suspected obstructive sleep apnea Sleep study pending.   4. HCM Mild basal and moderate mid interseptum hypertrophy on cardiac MRI.    Follow up in the AF clinic as scheduled after DCCV.    Ronald [name] [facility] [address] [place], [place] [address] [phone] 08/29/2019 9:12 AM

## 2019-08-28 NOTE — Progress Notes (Addendum)
  Primary Care Physician: Default, Provider, MD Primary Electrophysiologist: Dr Klein Referring Physician: Dr Klein   Ronald [name] is a 58 y.o. male with a history of paroxysmal atrial fibrillation and HCM who presents for follow up in the [place] [facility]. The patient was initially diagnosed with atrial fibrillation in 2012 in the setting of hyperthyroidism and underwent DCCV in 2013. Patient is not on anticoagulation for a CHADS2VASC score of 0. He has used flecainide with good success but this was d/c'd with his diagnosis of HCM. Patient reports he feels very well overall and has been very active for many years. He is anxious about his new diagnosis. His mother died suddenly from unknown causes. He denies significant alcohol use but does admit to snoring and apnea. His father, sister, and brother all have had a diagnosis of afib. Patient reports he felt he was back in afib the evening of 06/30/19. There were no triggers he could identify.   On follow up today, patient is s/p afib ablation on 08/14/19. Unfortunately,  he felt he was back in afib on 08/26/19 with symptoms of heart racing and fatigue. He resumed his diltiazem which did help alleviate his symptoms. There were no triggers that he could identify. He denies CP, swallowing, or groin issues.   Today, he denies symptoms of chest pain, shortness of breath, orthopnea, PND, lower extremity edema, dizziness, presyncope, syncope, bleeding, or neurologic sequela. The patient is tolerating medications without difficulties and is otherwise without complaint today.    Atrial Fibrillation Risk Factors:  he does have symptoms or diagnosis of sleep apnea. Sleep study pending. he does not have a history of rheumatic fever. he does not have a history of alcohol use. The patient does have a history of early familial atrial fibrillation or other arrhythmias.  he has a BMI of Body mass index is 27.35 kg/m.. Filed Weights    08/29/19 0837  Weight: 96.6 kg    Family History  Problem Relation Age of Onset  . Atrial fibrillation Father   . Atrial fibrillation Brother   . Atrial fibrillation Sister      Atrial Fibrillation Management history:  Previous antiarrhythmic drugs: flecainide Previous cardioversions: 2013 Previous ablations: 08/14/19 CHADS2VASC score: 0 Anticoagulation history: Eliquis   Past Medical History:  Diagnosis Date  . Asymmetric septal hypertrophy (HCC)   . Hyperthyroidism   . Persistent atrial fibrillation (HCC)    Past Surgical History:  Procedure Laterality Date  . ATRIAL FIBRILLATION ABLATION N/A 08/14/2019   Procedure: ATRIAL FIBRILLATION ABLATION;  Surgeon: Allred, James, MD;  Location: [facility] INV[facility] LAB;  Service: Cardiovascular;  Laterality: N/A;  . INGUINAL HERNIA REPAIR  1969    Current Outpatient Medications  Medication Sig Dispense Refill  . apixaban (ELIQUIS) 5 MG TABS tablet Take 1 tablet (5 mg total) by mouth 2 (two) times daily. 60 tablet 3  . diltiazem (CARDIZEM CD) 120 MG 24 hr capsule Take 1 capsule (120 mg total) by mouth in the morning and at bedtime. 30 capsule 3   No current facility-administered medications for this encounter.    Allergies  Allergen Reactions  . Ciprofloxacin Other (See Comments)    dizziness    Social History   Socioeconomic History  . Marital status: Married    Spouse name: Not on file  . Number of children: Not on file  . Years of education: Not on file  . Highest education level: Not on file  Occupational History  . Not on   file  Tobacco Use  . Smoking status: Never Smoker  . Smokeless tobacco: Never Used  Substance and Sexual Activity  . Alcohol use: Yes    Alcohol/week: 2.0 standard drinks    Types: 2 Cans of beer per week  . Drug use: Never  . Sexual activity: Not on file  Other Topics Concern  . Not on file  Social History Narrative   Lives in [place] with wife.   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   Social Determinants of Health   Financial Resource Strain:   . Difficulty of Paying Living Expenses:   Food Insecurity:   . Worried About Charity fundraiser in the Last Year:   . Arboriculturist in the Last Year:   Transportation Needs:   . Film/video editor (Medical):   Marland Kitchen Lack of Transportation (Non-Medical):   Physical Activity:   . Days of Exercise per Week:   . Minutes of Exercise per Session:   Stress:   . Feeling of Stress :   Social Connections:   . Frequency of Communication with Friends and Family:   . Frequency of Social Gatherings with Friends and Family:   . Attends Religious Services:   . Active Member of Clubs or Organizations:   . Attends Archivist Meetings:   Marland Kitchen Marital Status:   Intimate Partner Violence:   . Fear of Current or Ex-Partner:   . Emotionally Abused:   Marland Kitchen Physically Abused:   . Sexually Abused:      ROS- All systems are reviewed and negative except as per the HPI above.  Physical Exam: Vitals:   08/29/19 0837  BP: 140/90  Pulse: (!) 116  Weight: 96.6 kg  Height: 6\' 2"  (1.88 m)    GEN- The patient is well appearing, alert and oriented x 3 today.   HEENT-head normocephalic, atraumatic, sclera clear, conjunctiva pink, hearing intact, trachea midline. Lungs- Clear to ausculation bilaterally, normal work of breathing Heart- irregular rate and rhythm, tachycardia, no murmurs, rubs or gallops  GI- soft, NT, ND, + BS Extremities- no clubbing, cyanosis, or edema MS- no significant deformity or atrophy Skin- no rash or lesion Psych- euthymic mood, full affect Neuro- strength and sensation are intact   Wt Readings from Last 3 Encounters:  08/29/19 96.6 kg  08/14/19 93.9 kg  07/04/19 97.9 kg    EKG today demonstrates afib HR 116, QRS 80, QTc 403  Cardiac MRI 06/06/19 IMPRESSION: 1. Mild asymmetric hypertrophy in basal anteroseptum measuring 13mm (10mm in posterior wall), not meeting  criteria for HCM (<15 mm). However, there is focal moderate hypertrophy in the mid inferoseptum measuring up to 48mm, which does meet criteria for HCM  2. RV insertion site LGE, which can be seen in HCM. LGE accounts for <1% of total myocardial mass  3.  Mild LV dilatation with normal systolic function (EF 32%)  4.  Normal RV size and systolic function (EF 99%)  Epic records are reviewed at length today   CHA2DS2-VASc Score = 0 The patient's score is based upon: CHF History: No HTN History: No Age : < 65 Diabetes History: No Stroke History: No Vascular Disease History: No Gender: Male   ASSESSMENT AND PLAN: 1. Persistent Atrial Fibrillation (ICD10:  I48.0) The patient's CHA2DS2-VASc score is 0 S/p afib ablation with Dr Rayann Heman 08/14/19. Reassured patient that some afib is not unusual for the first 3 months post ablation.  Increase diltiazem  to 120 mg BID for rate control. Decrease back to daily dosing after DCCV. Will arrange for DCCV. Check bmet/CBC. Patient denies any missed doses of anticoagulation.  Continue Eliquis 5 mg BID.  2. Secondary hypercoagulable state Patient has a CHADS2VASC score of 0, however, with his h/o HCM anticoagulation is indicated.   3. Suspected obstructive sleep apnea Sleep study pending.   4. HCM Mild basal and moderate mid interseptum hypertrophy on cardiac MRI.    Follow up in the AF clinic as scheduled after DCCV.    Jorja Loa PA-C Af[facility] [address] [place], [place] [address] [phone] 08/29/2019 9:12 AM

## 2019-08-29 ENCOUNTER — Other Ambulatory Visit: Payer: Self-pay

## 2019-08-29 ENCOUNTER — Ambulatory Visit (HOSPITAL_COMMUNITY)
Admission: RE | Admit: 2019-08-29 | Discharge: 2019-08-29 | Disposition: A | Discharge status: Home / Self Care | Payer: Managed Care, Other (non HMO) | Source: Ambulatory Visit | Attending: Physician Assistant | Admitting: Physician Assistant

## 2019-08-29 ENCOUNTER — Encounter (HOSPITAL_COMMUNITY): Payer: Self-pay | Admitting: Physician Assistant

## 2019-08-29 VITALS — BP 140/90 | HR 116 | Ht 74.0 in | Wt 213.0 lb

## 2019-08-29 DIAGNOSIS — R0683 Snoring: Secondary | ICD-10-CM | POA: Diagnosis not present

## 2019-08-29 DIAGNOSIS — G4730 Sleep apnea, unspecified: Secondary | ICD-10-CM | POA: Insufficient documentation

## 2019-08-29 DIAGNOSIS — D6869 Other thrombophilia: Secondary | ICD-10-CM | POA: Diagnosis not present

## 2019-08-29 DIAGNOSIS — Z881 Allergy status to other antibiotic agents status: Secondary | ICD-10-CM | POA: Diagnosis not present

## 2019-08-29 DIAGNOSIS — Z7901 Long term (current) use of anticoagulants: Secondary | ICD-10-CM | POA: Insufficient documentation

## 2019-08-29 DIAGNOSIS — Z8249 Family history of ischemic heart disease and other diseases of the circulatory system: Secondary | ICD-10-CM | POA: Diagnosis not present

## 2019-08-29 DIAGNOSIS — I4819 Other persistent atrial fibrillation: Secondary | ICD-10-CM | POA: Insufficient documentation

## 2019-08-29 DIAGNOSIS — E0590 Thyrotoxicosis, unspecified without thyrotoxic crisis or storm: Secondary | ICD-10-CM | POA: Insufficient documentation

## 2019-08-29 DIAGNOSIS — Z79899 Other long term (current) drug therapy: Secondary | ICD-10-CM | POA: Diagnosis not present

## 2019-08-29 DIAGNOSIS — I517 Cardiomegaly: Secondary | ICD-10-CM | POA: Diagnosis not present

## 2019-08-29 LAB — BASIC METABOLIC PANEL
Anion gap: 12 (ref 5–15)
BUN: 12 mg/dL (ref 6–20)
CO2: 22 mmol/L (ref 22–32)
Calcium: 9.3 mg/dL (ref 8.9–10.3)
Chloride: 104 mmol/L (ref 98–111)
Creatinine, Ser: 1.03 mg/dL (ref 0.61–1.24)
GFR calc Af Amer: >60 mL/min (ref >60)
GFR calc non Af Amer: >60 mL/min (ref >60)
Glucose, Bld: 84 mg/dL (ref 70–99)
Potassium: 4.4 mmol/L (ref 3.5–5.1)
Sodium: 138 mmol/L (ref 135–145)

## 2019-08-29 LAB — CBC
HCT: 47.1 % (ref 39.0–52.0)
Hemoglobin: 16.1 g/dL (ref 13.0–17.0)
MCH: 30.8 pg (ref 26.0–34.0)
MCHC: 34.2 g/dL (ref 30.0–36.0)
MCV: 90.2 fL (ref 80.0–100.0)
Platelets: 244 10*3/uL (ref 150–400)
RBC: 5.22 MIL/uL (ref 4.22–5.81)
RDW: 12 % (ref 11.5–15.5)
WBC: 4.7 10*3/uL (ref 4.0–10.5)
nRBC: 0 % (ref 0.0–0.2)

## 2019-08-29 MED ORDER — DILTIAZEM HCL ER COATED BEADS 120 MG PO CP24
120.0000 mg | ORAL_CAPSULE | Freq: Two times a day (BID) | ORAL | 3 refills | Status: DC
Start: 2019-08-29 — End: 2019-09-03

## 2019-08-29 NOTE — Patient Instructions (Addendum)
Increase Diltiazem 120mg  twice daily -- on Tuesday go back to 120mg  once a day    Cardioversion scheduled for Wednesday, June 30th  - Arrive at the and go to admitting at 8:30AM  - Do not eat or drink anything after midnight the night prior to your procedure.  - Take all your morning medication (except diabetic medications) with a sip of water prior to arrival.  - You will not be able to drive home after your procedure.  - Do NOT miss any doses of your blood thinner - if you should miss a dose please notify our office immediately.

## 2019-09-01 ENCOUNTER — Other Ambulatory Visit (HOSPITAL_COMMUNITY)
Admission: RE | Admit: 2019-09-01 | Discharge: 2019-09-01 | Disposition: A | Discharge status: Home / Self Care | Payer: Managed Care, Other (non HMO) | Source: Ambulatory Visit | Attending: Internal Medicine | Admitting: Internal Medicine

## 2019-09-01 DIAGNOSIS — Z20822 Contact with and (suspected) exposure to covid-19: Secondary | ICD-10-CM | POA: Insufficient documentation

## 2019-09-01 DIAGNOSIS — Z01812 Encounter for preprocedural laboratory examination: Secondary | ICD-10-CM | POA: Insufficient documentation

## 2019-09-01 LAB — SARS CORONAVIRUS 2 (TAT 6-24 HRS): SARS Coronavirus 2: NEGATIVE

## 2019-09-03 ENCOUNTER — Ambulatory Visit (HOSPITAL_COMMUNITY): Payer: Managed Care, Other (non HMO) | Admitting: Anesthesiology

## 2019-09-03 ENCOUNTER — Encounter (HOSPITAL_COMMUNITY)
Admission: RE | Disposition: A | Discharge status: Home / Self Care | Payer: Self-pay | Source: Home / Self Care | Attending: Internal Medicine

## 2019-09-03 ENCOUNTER — Ambulatory Visit (HOSPITAL_COMMUNITY)
Admission: RE | Admit: 2019-09-03 | Discharge: 2019-09-03 | Disposition: A | Discharge status: Home / Self Care | Payer: Managed Care, Other (non HMO) | Attending: Internal Medicine | Admitting: Internal Medicine

## 2019-09-03 ENCOUNTER — Other Ambulatory Visit: Payer: Self-pay

## 2019-09-03 ENCOUNTER — Encounter (HOSPITAL_COMMUNITY): Payer: Self-pay | Admitting: Internal Medicine

## 2019-09-03 DIAGNOSIS — E0590 Thyrotoxicosis, unspecified without thyrotoxic crisis or storm: Secondary | ICD-10-CM | POA: Diagnosis not present

## 2019-09-03 DIAGNOSIS — D6859 Other primary thrombophilia: Secondary | ICD-10-CM | POA: Diagnosis not present

## 2019-09-03 DIAGNOSIS — Z79899 Other long term (current) drug therapy: Secondary | ICD-10-CM | POA: Insufficient documentation

## 2019-09-03 DIAGNOSIS — Z7901 Long term (current) use of anticoagulants: Secondary | ICD-10-CM | POA: Diagnosis not present

## 2019-09-03 DIAGNOSIS — Z8249 Family history of ischemic heart disease and other diseases of the circulatory system: Secondary | ICD-10-CM | POA: Insufficient documentation

## 2019-09-03 DIAGNOSIS — I4819 Other persistent atrial fibrillation: Secondary | ICD-10-CM | POA: Diagnosis not present

## 2019-09-03 DIAGNOSIS — Z881 Allergy status to other antibiotic agents status: Secondary | ICD-10-CM | POA: Insufficient documentation

## 2019-09-03 HISTORY — PX: CARDIOVERSION: SHX1299

## 2019-09-03 SURGERY — CARDIOVERSION
Anesthesia: General

## 2019-09-03 MED ORDER — PROPOFOL 10 MG/ML IV BOLUS
INTRAVENOUS | Status: DC | PRN
  Administered 2019-09-03: 100 mg via INTRAVENOUS

## 2019-09-03 MED ORDER — SODIUM CHLORIDE 0.9 % IV SOLN
INTRAVENOUS | Status: AC | PRN
  Administered 2019-09-03: 500 mL via INTRAMUSCULAR

## 2019-09-03 MED ORDER — LIDOCAINE 2% (20 MG/ML) 5 ML SYRINGE
INTRAMUSCULAR | Status: DC | PRN
  Administered 2019-09-03: 60 mg via INTRAVENOUS

## 2019-09-03 MED ORDER — DILTIAZEM HCL ER COATED BEADS 120 MG PO CP24
120.0000 mg | ORAL_CAPSULE | Freq: Every day | ORAL | 3 refills | Status: DC
Start: 2019-09-03 — End: 2019-11-12

## 2019-09-03 MED ORDER — SODIUM CHLORIDE 0.9 % IV SOLN: INTRAVENOUS | Status: DC | PRN

## 2019-09-03 NOTE — CV Procedure (Signed)
Procedure: Electrical Cardioversion Indications:  Atrial Fibrillation post ablation  Procedure Details:  Consent: Risks of procedure as well as the alternatives and risks of each were explained to the (patient/caregiver).  Consent for procedure obtained.  Time Out: Verified patient identification, verified procedure, site/side was marked, verified correct patient position, special equipment/implants available, medications/allergies/relevent history reviewed, required imaging and test results available. PERFORMED.  Patient placed on cardiac monitor, pulse oximetry, supplemental oxygen as necessary.  Sedation given: propofol per anesthesia Pacer pads placed anterior and posterior chest.  Cardioverted 1 time(s).  Cardioversion with synchronized biphasic 120J shock.  Evaluation: Findings: Post procedure EKG shows: NSR Complications: None Patient did tolerate procedure well.  Time Spent Directly with the Patient:  30 minutes   [name] 09/03/2019, 10:09 AM

## 2019-09-03 NOTE — Discharge Instructions (Signed)
Electrical Cardioversion Electrical cardioversion is the delivery of a jolt of electricity to restore a normal rhythm to the heart. A rhythm that is too fast or is not regular keeps the heart from pumping well. In this procedure, sticky patches or metal paddles are placed on the chest to deliver electricity to the heart from a device. This procedure may be done in an emergency if:  There is low or no blood pressure as a result of the heart rhythm.  Normal rhythm must be restored as fast as possible to protect the brain and heart from further damage.  It may save a life. This may also be a scheduled procedure for irregular or fast heart rhythms that are not immediately life-threatening. Tell a health care provider about:  Any allergies you have.  All medicines you are taking, including vitamins, herbs, eye drops, creams, and over-the-counter medicines.  Any problems you or family members have had with anesthetic medicines.  Any blood disorders you have.  Any surgeries you have had.  Any medical conditions you have.  Whether you are pregnant or may be pregnant. What are the risks? Generally, this is a safe procedure. However, problems may occur, including:  Allergic reactions to medicines.  A blood clot that breaks free and travels to other parts of your body.  The possible return of an abnormal heart rhythm within hours or days after the procedure.  Your heart stopping (cardiac arrest). This is rare. What happens before the procedure? Medicines  Your health care provider may have you start taking: ? Blood-thinning medicines (anticoagulants) so your blood does not clot as easily. ? Medicines to help stabilize your heart rate and rhythm.  Ask your health care provider about: ? Changing or stopping your regular medicines. This is especially important if you are taking diabetes medicines or blood thinners. ? Taking medicines such as aspirin and ibuprofen. These medicines can  thin your blood. Do not take these medicines unless your health care provider tells you to take them. ? Taking over-the-counter medicines, vitamins, herbs, and supplements. General instructions  Follow instructions from your health care provider about eating or drinking restrictions.  Plan to have someone take you home from the hospital or clinic.  If you will be going home right after the procedure, plan to have someone with you for 24 hours.  Ask your health care provider what steps will be taken to help prevent infection. These may include washing your skin with a germ-killing soap. What happens during the procedure?   An IV will be inserted into one of your veins.  Sticky patches (electrodes) or metal paddles may be placed on your chest.  You will be given a medicine to help you relax (sedative).  An electrical shock will be delivered. The procedure may vary among health care providers and hospitals. What can I expect after the procedure?  Your blood pressure, heart rate, breathing rate, and blood oxygen level will be monitored until you leave the hospital or clinic.  Your heart rhythm will be watched to make sure it does not change.  You may have some redness on the skin where the shocks were given. Follow these instructions at home:  Do not drive for 24 hours if you were given a sedative during your procedure.  Take over-the-counter and prescription medicines only as told by your health care provider.  Ask your health care provider how to check your pulse. Check it often.  Rest for 48 hours after the procedure or   as told by your health care provider.  Avoid or limit your caffeine use as told by your health care provider.  Keep all follow-up visits as told by your health care provider. This is important. Contact a health care provider if:  You feel like your heart is beating too quickly or your pulse is not regular.  You have a serious muscle cramp that does not go  away. Get help right away if:  You have discomfort in your chest.  You are dizzy or you feel faint.  You have trouble breathing or you are short of breath.  Your speech is slurred.  You have trouble moving an arm or leg on one side of your body.  Your fingers or toes turn cold or blue. Summary  Electrical cardioversion is the delivery of a jolt of electricity to restore a normal rhythm to the heart.  This procedure may be done right away in an emergency or may be a scheduled procedure if the condition is not an emergency.  Generally, this is a safe procedure.  After the procedure, check your pulse often as told by your health care provider. This information is not intended to replace advice given to you by your health care provider. Make sure you discuss any questions you have with your health care provider. Document Revised: 09/23/2018 Document Reviewed: 09/23/2018 Elsevier Patient Education  2020 [employer]  

## 2019-09-03 NOTE — Anesthesia Preprocedure Evaluation (Addendum)
Anesthesia Evaluation  Patient identified by MRN, date of birth, ID band Patient awake    Reviewed: Allergy & Precautions, NPO status , Patient's Chart, lab work & pertinent test results  History of Anesthesia Complications Negative for: history of anesthetic complications  Airway Mallampati: I  TM Distance: >3 FB Neck ROM: Full    Dental  (+) Dental Advisory Given   Pulmonary neg pulmonary ROS,  6/282021 SARS coronavirus NEG   breath sounds clear to auscultation       Cardiovascular hypertension, Pt. on medications (-) angina+ dysrhythmias Atrial Fibrillation  Rhythm:Irregular Rate:Normal  '19 ECHO: EF 64%, valves OK   Neuro/Psych negative neurological ROS     GI/Hepatic negative GI ROS, Neg liver ROS,   Endo/Other  negative endocrine ROS  Renal/GU negative Renal ROS     Musculoskeletal   Abdominal   Peds  Hematology eliquis   Anesthesia Other Findings   Reproductive/Obstetrics                            Anesthesia Physical Anesthesia Plan  ASA: III  Anesthesia Plan: General   Post-op Pain Management:    Induction: Intravenous  PONV Risk Score and Plan: 2 and Treatment may vary due to age or medical condition  Airway Management Planned: Natural Airway and Mask  Additional Equipment: None  Intra-op Plan:   Post-operative Plan:   Informed Consent: I have reviewed the patients History and Physical, chart, labs and discussed the procedure including the risks, benefits and alternatives for the proposed anesthesia with the patient or authorized representative who has indicated his/her understanding and acceptance.     Dental advisory given  Plan Discussed with: CRNA and Surgeon  Anesthesia Plan Comments:        Anesthesia Quick Evaluation

## 2019-09-03 NOTE — Anesthesia Postprocedure Evaluation (Signed)
Anesthesia Post Note  Patient: Ronald [name]  Procedure(s) Performed: CARDIOVERSION (N/A )     Patient location during evaluation: Endoscopy Anesthesia Type: General Level of consciousness: awake and alert, oriented and patient cooperative Pain management: pain level controlled Vital Signs Assessment: post-procedure vital signs reviewed and stable Respiratory status: spontaneous breathing, nonlabored ventilation and respiratory function stable Cardiovascular status: blood pressure returned to baseline and stable Postop Assessment: no apparent nausea or vomiting and able to ambulate Anesthetic complications: no   No complications documented.  Last Vitals:  Vitals:   09/03/19 1018 09/03/19 1029  BP: 106/78 119/85  Pulse: 71 73  Resp: 18 13  Temp: 36.8 C   SpO2: 96% 97%    Last Pain:  Vitals:   09/03/19 1029  TempSrc:   PainSc: 0-No pain                 Marcas Bowsher,E. Meliana Canner

## 2019-09-03 NOTE — Anesthesia Procedure Notes (Signed)
Procedure Name: General with mask airway Date/Time: 09/03/2019 10:07 AM Performed by: Quentin Ore, CRNA Pre-anesthesia Checklist: Patient identified, Emergency Drugs available, Suction available and Patient being monitored Patient Re-evaluated:Patient Re-evaluated prior to induction Oxygen Delivery Method: Ambu bag Preoxygenation: Pre-oxygenation with 100% oxygen Induction Type: IV induction Ventilation: Mask ventilation without difficulty Dental Injury: Teeth and Oropharynx as per pre-operative assessment

## 2019-09-03 NOTE — Interval H&P Note (Signed)
History and Physical Interval Note:  09/03/2019 9:53 AM  Ronald [name]  has presented today for surgery, with the diagnosis of AFIB.  The various methods of treatment have been discussed with the patient and family. After consideration of risks, benefits and other options for treatment, the patient has consented to  Procedure(s): CARDIOVERSION (N/A) as a surgical intervention.  The patient's history has been reviewed, patient examined, no change in status, stable for surgery.  I have reviewed the patient's chart and labs.  Questions were answered to the patient's satisfaction.     [name]

## 2019-09-03 NOTE — Transfer of Care (Signed)
Immediate Anesthesia Transfer of Care Note  Patient: Ronald [name]  Procedure(s) Performed: CARDIOVERSION (N/A )  Patient Location: Endoscopy Unit  Anesthesia Type:General  Level of Consciousness: awake, alert  and oriented  Airway & Oxygen Therapy: Patient Spontanous Breathing  Post-op Assessment: Report given to RN, Post -op Vital signs reviewed and stable and Patient moving all extremities X 4  Post vital signs: Reviewed and stable  Last Vitals:  Vitals Value Taken Time  BP    Temp    Pulse    Resp    SpO2      Last Pain:  Vitals:   09/03/19 0818  TempSrc: Oral  PainSc: 0-No pain         Complications: No complications documented.

## 2019-09-04 ENCOUNTER — Encounter (HOSPITAL_COMMUNITY): Payer: Self-pay | Admitting: Internal Medicine

## 2019-09-11 ENCOUNTER — Other Ambulatory Visit: Payer: Self-pay

## 2019-09-11 ENCOUNTER — Ambulatory Visit (HOSPITAL_COMMUNITY)
Admission: RE | Admit: 2019-09-11 | Discharge: 2019-09-11 | Disposition: A | Discharge status: Home / Self Care | Payer: Managed Care, Other (non HMO) | Source: Ambulatory Visit | Attending: Physician Assistant | Admitting: Physician Assistant

## 2019-09-11 ENCOUNTER — Encounter (HOSPITAL_COMMUNITY): Payer: Self-pay | Admitting: Physician Assistant

## 2019-09-11 VITALS — BP 140/80 | HR 73 | Ht 74.0 in | Wt 211.6 lb

## 2019-09-11 DIAGNOSIS — Z7901 Long term (current) use of anticoagulants: Secondary | ICD-10-CM | POA: Diagnosis not present

## 2019-09-11 DIAGNOSIS — I517 Cardiomegaly: Secondary | ICD-10-CM | POA: Diagnosis not present

## 2019-09-11 DIAGNOSIS — D6859 Other primary thrombophilia: Secondary | ICD-10-CM | POA: Diagnosis not present

## 2019-09-11 DIAGNOSIS — I480 Paroxysmal atrial fibrillation: Secondary | ICD-10-CM | POA: Insufficient documentation

## 2019-09-11 DIAGNOSIS — Z79899 Other long term (current) drug therapy: Secondary | ICD-10-CM | POA: Diagnosis not present

## 2019-09-11 DIAGNOSIS — I4819 Other persistent atrial fibrillation: Secondary | ICD-10-CM

## 2019-09-11 DIAGNOSIS — D6869 Other thrombophilia: Secondary | ICD-10-CM

## 2019-09-11 DIAGNOSIS — Z8249 Family history of ischemic heart disease and other diseases of the circulatory system: Secondary | ICD-10-CM | POA: Insufficient documentation

## 2019-09-11 NOTE — Progress Notes (Signed)
Primary Care Physician: Patient, No Pcp Per Primary Electrophysiologist: Dr Graciela Husbands Referring Physician: Dr Rowe Pavy is a 59 y.o. male with a history of paroxysmal atrial fibrillation and HCM who presents for follow up in the [facility]. The patient was initially diagnosed with atrial fibrillation in 2012 in the setting of hyperthyroidism and underwent DCCV in 2013. Patient is not on anticoagulation for a CHADS2VASC score of 0. He has used flecainide with good success but this was d/c'd with his diagnosis of HCM. Patient reports he feels very well overall and has been very active for many years. He is anxious about his new diagnosis. His mother died suddenly from unknown causes. He denies significant alcohol use but does admit to snoring and apnea. His father, sister, and brother all have had a diagnosis of afib. Patient reports he felt he was back in afib the evening of 06/30/19. Patient is s/p afib ablation on 08/14/19. Unfortunately,  he felt he was back in afib on 08/26/19 with symptoms of heart racing and fatigue. He resumed his diltiazem which did help alleviate his symptoms. There were no triggers that he could identify.   On follow up today, patient is s/p DCCV on 09/03/19. He reports that he has done well since then with no further heart racing. He is in SR today. He denies bleeding issues on anticoagulation.   Today, he denies symptoms of palpitations, chest pain, shortness of breath, orthopnea, PND, lower extremity edema, dizziness, presyncope, syncope, bleeding, or neurologic sequela. The patient is tolerating medications without difficulties and is otherwise without complaint today.    Atrial Fibrillation Risk Factors:  he does have symptoms or diagnosis of sleep apnea. Sleep study pending. he does not have a history of rheumatic fever. he does not have a history of alcohol use. The patient does have a history of early familial atrial  fibrillation or other arrhythmias.  he has a BMI of Body mass index is 27.17 kg/m.Marland Kitchen Filed Weights   09/11/19 1002  Weight: 96 kg    Family History  Problem Relation Age of Onset  . Atrial fibrillation Father   . Atrial fibrillation Brother   . Atrial fibrillation Sister      Atrial Fibrillation Management history:  Previous antiarrhythmic drugs: flecainide Previous cardioversions: 2013, 09/03/19 Previous ablations: 08/14/19 CHADS2VASC score: 0 Anticoagulation history: Eliquis   Past Medical History:  Diagnosis Date  . Asymmetric septal hypertrophy (HCC)   . Hyperthyroidism   . Persistent atrial fibrillation [facility])    Past Surgical History:  Procedure Laterality Date  . ATRIAL FIBRILLATION ABLATION N/A 08/14/2019   Procedure: ATRIAL FIBRILLATION ABLATION;  Surgeon: Hillis Range, MD;  Location: [facility] INVASIVE CV LAB;  Service: Cardiovascular;  Laterality: N/A;  . CARDIOVERSION N/A 09/03/2019   Procedure: CARDIOVERSION;  Surgeon: [name], MD;  Location: [facility] ENDOSCOPY;  Service: Cardiovascular;  Laterality: N/A;  . INGUINAL HERNIA REPAIR  1969    Current Outpatient Medications  Medication Sig Dispense Refill  . apixaban (ELIQUIS) 5 MG TABS tablet Take 1 tablet (5 mg total) by mouth 2 (two) times daily. 60 tablet 3  . diltiazem (CARDIZEM CD) 120 MG 24 hr capsule Take 1 capsule (120 mg total) by mouth daily. Do not take if heart rate is less than 60 beats per minute 30 capsule 3  . Multiple Vitamin (MULTIVITAMIN WITH MINERALS) TABS tablet Take 1 tablet by mouth 2 (two) times a week.     No current facility-administered  medications for this encounter.    Allergies  Allergen Reactions  . Ciprofloxacin Other (See Comments)    dizziness    Social History   Socioeconomic History  . Marital status: Married    Spouse name: Not on file  . Number of children: Not on file  . Years of education: Not on file  . Highest education level: Not on file  Occupational  History  . Not on file  Tobacco Use  . Smoking status: Never Smoker  . Smokeless tobacco: Never Used  Substance and Sexual Activity  . Alcohol use: Yes    Alcohol/week: 2.0 standard drinks    Types: 2 Cans of beer per week  . Drug use: Never  . Sexual activity: Not on file  Other Topics Concern  . Not on file  Social History Narrative   Lives in [place] with wife.   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   Social Determinants of Health   Financial Resource Strain:   . Difficulty of Paying Living Expenses:   Food Insecurity:   . Worried About Programme researcher, broadcasting/film/video in the Last Year:   . Barista in the Last Year:   Transportation Needs:   . Freight forwarder (Medical):   Marland Kitchen Lack of Transportation (Non-Medical):   Physical Activity:   . Days of Exercise per Week:   . Minutes of Exercise per Session:   Stress:   . Feeling of Stress :   Social Connections:   . Frequency of Communication with Friends and Family:   . Frequency of Social Gatherings with Friends and Family:   . Attends Religious Services:   . Active Member of Clubs or Organizations:   . Attends Banker Meetings:   Marland Kitchen Marital Status:   Intimate Partner Violence:   . Fear of Current or Ex-Partner:   . Emotionally Abused:   Marland Kitchen Physically Abused:   . Sexually Abused:      ROS- All systems are reviewed and negative except as per the HPI above.  Physical Exam: Vitals:   09/11/19 1002  BP: 140/80  Pulse: 73  Weight: 96 kg  Height: 6\' 2"  (1.88 m)    GEN- The patient is well appearing, alert and oriented x 3 today.   HEENT-head normocephalic, atraumatic, sclera clear, conjunctiva pink, hearing intact, trachea midline. Lungs- Clear to ausculation bilaterally, normal work of breathing Heart- Regular rate and rhythm, no murmurs, rubs or gallops  GI- soft, NT, ND, + BS Extremities- no clubbing, cyanosis, or edema MS- no significant deformity or  atrophy Skin- no rash or lesion Psych- euthymic mood, full affect Neuro- strength and sensation are intact   Wt Readings from Last 3 Encounters:  09/11/19 96 kg  09/03/19 96.6 kg  08/29/19 96.6 kg    EKG today demonstrates SR HR 73, PR 144, QRS 82, QTc 414  Cardiac MRI 06/06/19 IMPRESSION: 1. Mild asymmetric hypertrophy in basal anteroseptum measuring 74mm (59mm in posterior wall), not meeting criteria for HCM (<15 mm). However, there is focal moderate hypertrophy in the mid inferoseptum measuring up to 26mm, which does meet criteria for HCM  2. RV insertion site LGE, which can be seen in HCM. LGE accounts for <1% of total myocardial mass  3.  Mild LV dilatation with normal systolic function (EF 55%)  4.  Normal RV size and systolic function (EF 53%)  Epic records are reviewed at length today   CHA2DS2-VASc  Score = 0 The patient's score is based upon: CHF History: No HTN History: No Age : < 65 Diabetes History: No Stroke History: No Vascular Disease History: No Gender: Male   ASSESSMENT AND PLAN: 1. Persistent Atrial Fibrillation (ICD10:  I48.0) The patient's CHA2DS2-VASc score is 0 S/p afib ablation with Dr Johney Frame 08/14/19 with DCCV on 09/03/19. Continue diltiazem 120 mg daily Continue Eliquis 5 mg BID.  2. Secondary hypercoagulable state Patient has a CHADS2VASC score of 0, however, with his h/o HCM anticoagulation is indicated.   3. Suspected obstructive sleep apnea Sleep study pending.   4. HCM Mild basal and moderate mid interseptum hypertrophy on cardiac MRI.    Follow up with Dr Johney Frame as scheduled.    Jorja Loa PA-C Afib Clinic [facility] [address] [place], [place] [address] [phone] 09/11/2019 10:16 AM

## 2019-09-25 ENCOUNTER — Ambulatory Visit (HOSPITAL_COMMUNITY): Payer: Managed Care, Other (non HMO) | Admitting: Physician Assistant

## 2019-10-24 ENCOUNTER — Other Ambulatory Visit (HOSPITAL_COMMUNITY): Payer: Self-pay | Admitting: Physician Assistant

## 2019-11-12 ENCOUNTER — Encounter: Payer: Self-pay | Admitting: Internal Medicine

## 2019-11-12 ENCOUNTER — Ambulatory Visit (INDEPENDENT_AMBULATORY_CARE_PROVIDER_SITE_OTHER): Payer: Managed Care, Other (non HMO) | Admitting: Internal Medicine

## 2019-11-12 ENCOUNTER — Other Ambulatory Visit: Payer: Self-pay

## 2019-11-12 VITALS — BP 136/86 | HR 71 | Ht 74.0 in | Wt 214.0 lb

## 2019-11-12 DIAGNOSIS — I4819 Other persistent atrial fibrillation: Secondary | ICD-10-CM

## 2019-11-12 NOTE — Progress Notes (Signed)
° °  PCP: Patient, No Pcp Per Primary Cardiologist:  Dr Rowe Pavy is a 59 y.o. male who presents today for routine electrophysiology followup.  Since his recent afib ablation, the patient reports doing very well.  he denies procedure related complications and is pleased with the results of the procedure.  Today, he denies symptoms of palpitations, chest pain, shortness of breath,  lower extremity edema, dizziness, presyncope, or syncope.  The patient is otherwise without complaint today.   Past Medical History:  Diagnosis Date   Asymmetric septal hypertrophy (HCC)    Hyperthyroidism    Persistent atrial fibrillation (HCC)    Past Surgical History:  Procedure Laterality Date   ATRIAL FIBRILLATION ABLATION N/A 08/14/2019   Procedure: ATRIAL FIBRILLATION ABLATION;  Surgeon: Hillis Range, MD;  Location: [facility] INVASIVE CV LAB;  Service: Cardiovascular;  Laterality: N/A;   CARDIOVERSION N/A 09/03/2019   Procedure: CARDIOVERSION;  Surgeon: [name], MD;  Location: [facility] ENDOSCOPY;  Service: Cardiovascular;  Laterality: N/A;   INGUINAL HERNIA REPAIR  1969    ROS- all systems are personally reviewed and negatives except as per HPI above  Current Outpatient Medications  Medication Sig Dispense Refill   diltiazem (CARDIZEM CD) 120 MG 24 hr capsule Take 1 capsule (120 mg total) by mouth daily. Do not take if heart rate is less than 60 beats per minute 30 capsule 3   ELIQUIS 5 MG TABS tablet TAKE ONE TABLET BY MOUTH TWICE A DAY 60 tablet 3   Multiple Vitamins-Minerals (MULTIVITAMIN WITH MINERALS) tablet Take 1 tablet by mouth daily.     No current facility-administered medications for this visit.    Physical Exam: Vitals:   11/12/19 1556  BP: 136/86  Pulse: 71  SpO2: 95%  Weight: 214 lb (97.1 kg)  Height: 6\' 2"  (1.88 m)    GEN- The patient is well appearing, alert and oriented x 3 today.   Head- normocephalic, atraumatic Eyes-  Sclera clear, conjunctiva  pink Ears- hearing intact Oropharynx- clear Lungs-  normal work of breathing Heart- Regular rate and rhythm  GI- soft  Extremities- no clubbing, cyanosis, or edema  EKG tracing ordered today is personally reviewed and shows sinus rhythm, early repolarization  Assessment and Plan:  1. Persistent atrial fibrillation Doing well s/p ablation chads2vasc score is 0 Stop eliquis Stop diltiazem  2. LV hypertrophy Mid basal and moderate mid interseptal hypertrophy noted on prior MRI No obstruction by prior echo Does not meet criteria for HCM No changes  3. Snoring Sleep study is pending  Return to see me in 4 months  MD, [facility] 11/12/2019 4:20 PM

## 2019-11-12 NOTE — Patient Instructions (Addendum)
Medication Instructions:   Stop Cardizem   Stop Eliquis  *If you need a refill on your cardiac medications before your next appointment, please call your pharmacy*  Lab Work: None ordered.  If you have labs (blood work) drawn today and your tests are completely normal, you will receive your results only by: Marland Kitchen MyChart Message (if you have MyChart) OR . A paper copy in the mail If you have any lab test that is abnormal or we need to change your treatment, we will call you to review the results.  Testing/Procedures: None ordered.  Follow-Up: At [facility], you and your health needs are our priority.  As part of our continuing mission to provide you with exceptional heart care, we have created designated Provider Care Teams.  These Care Teams include your primary Cardiologist (physician) and Advanced Practice Providers (APPs -  Physician Assistants and Nurse Practitioners) who all work together to provide you with the care you need, when you need it.  We recommend signing up for the patient portal called "MyChart".  Sign up information is provided on this After Visit Summary.  MyChart is used to connect with patients for Virtual Visits (Telemedicine).  Patients are able to view lab/test results, encounter notes, upcoming appointments, etc.  Non-urgent messages can be sent to your provider as well.   To learn more about what you can do with MyChart, go to [email or web address].    Your next appointment:   Your physician wants you to follow-up in: 03/16/19 at 4 pm with Dr. Johney Frame.     Other Instructions:

## 2019-11-13 NOTE — Addendum Note (Signed)
Addended by: Shandie Bertz H on: 11/13/2019 04:39 PM   Modules accepted: Orders  

## 2019-11-26 ENCOUNTER — Ambulatory Visit: Payer: Managed Care, Other (non HMO) | Admitting: Internal Medicine

## 2020-03-03 ENCOUNTER — Encounter: Payer: Self-pay | Admitting: *Deleted

## 2020-03-15 ENCOUNTER — Ambulatory Visit: Payer: Managed Care, Other (non HMO) | Admitting: Internal Medicine

## 2020-03-29 ENCOUNTER — Other Ambulatory Visit: Payer: Self-pay

## 2020-03-29 ENCOUNTER — Telehealth: Payer: Self-pay | Admitting: *Deleted

## 2020-03-29 ENCOUNTER — Ambulatory Visit (INDEPENDENT_AMBULATORY_CARE_PROVIDER_SITE_OTHER): Payer: Managed Care, Other (non HMO) | Admitting: Internal Medicine

## 2020-03-29 ENCOUNTER — Encounter: Payer: Self-pay | Admitting: Internal Medicine

## 2020-03-29 VITALS — BP 132/84 | HR 72 | Ht 74.0 in | Wt 219.2 lb

## 2020-03-29 DIAGNOSIS — R0683 Snoring: Secondary | ICD-10-CM | POA: Diagnosis not present

## 2020-03-29 DIAGNOSIS — I4819 Other persistent atrial fibrillation: Secondary | ICD-10-CM

## 2020-03-29 DIAGNOSIS — R5383 Other fatigue: Secondary | ICD-10-CM | POA: Diagnosis not present

## 2020-03-29 NOTE — Patient Instructions (Signed)
Medication Instructions:  Continue current medications  *If you need a refill on your cardiac medications before your next appointment, please call your pharmacy*   Lab Work: none If you have labs (blood work) drawn today and your tests are completely normal, you will receive your results only by: Marland Kitchen MyChart Message (if you have MyChart) OR . A paper copy in the mail If you have any lab test that is abnormal or we need to change your treatment, we will call you to review the results.   Testing/Procedures: Your physician has recommended that you have a sleep study. This test records several body functions during sleep, including: brain activity, eye movement, oxygen and carbon dioxide blood levels, heart rate and rhythm, breathing rate and rhythm, the flow of air through your mouth and nose, snoring, body muscle movements, and chest and belly movement.  Follow-Up: At [facility], you and your health needs are our priority.  As part of our continuing mission to provide you with exceptional heart care, we have created designated Provider Care Teams.  These Care Teams include your primary Cardiologist (physician) and Advanced Practice Providers (APPs -  Physician Assistants and Nurse Practitioners) who all work together to provide you with the care you need, when you need it.  We recommend signing up for the patient portal called "MyChart".  Sign up information is provided on this After Visit Summary.  MyChart is used to connect with patients for Virtual Visits (Telemedicine).  Patients are able to view lab/test results, encounter notes, upcoming appointments, etc.  Non-urgent messages can be sent to your provider as well.   To learn more about what you can do with MyChart, go to [email or web address].    Your next appointment:   12 month(s)  The format for your next appointment:   In Person  Provider:   Hillis Range, MD

## 2020-03-29 NOTE — Progress Notes (Signed)
   PCP: Patient, No Pcp Per   Ronald [name] is a 60 y.o. male who presents today for routine electrophysiology followup.  Since last being seen in our clinic, the patient reports doing very well.  Today, he denies symptoms of palpitations, chest pain, shortness of breath,  lower extremity edema, dizziness, presyncope, or syncope.  The patient is otherwise without complaint today.   Past Medical History:  Diagnosis Date  . Asymmetric septal hypertrophy (HCC)   . Hyperthyroidism   . Persistent atrial fibrillation [facility])    Past Surgical History:  Procedure Laterality Date  . ATRIAL FIBRILLATION ABLATION N/A 08/14/2019   Procedure: ATRIAL FIBRILLATION ABLATION;  Surgeon: Hillis Range, MD;  Location: [facility] INVASIVE CV LAB;  Service: Cardiovascular;  Laterality: N/A;  . CARDIOVERSION N/A 09/03/2019   Procedure: CARDIOVERSION;  Surgeon: [name], MD;  Location: [facility] ENDOSCOPY;  Service: Cardiovascular;  Laterality: N/A;  . INGUINAL HERNIA REPAIR  1969    ROS- all systems are reviewed and negatives except as per HPI above  Current Outpatient Medications  Medication Sig Dispense Refill  . Multiple Vitamins-Minerals (MULTIVITAMIN WITH MINERALS) tablet Take 1 tablet by mouth daily.     No current facility-administered medications for this visit.    Physical Exam: Vitals:   03/29/20 1226  BP: 132/84  Pulse: 72  SpO2: 95%  Weight: 219 lb 3.2 oz (99.4 kg)  Height: 6\' 2"  (1.88 m)    GEN- The patient is well appearing, alert and oriented x 3 today.   Head- normocephalic, atraumatic Eyes-  Sclera clear, conjunctiva pink Ears- hearing intact Oropharynx- clear Lungs- Clear to ausculation bilaterally, normal work of breathing Heart- Regular rate and rhythm, no murmurs, rubs or gallops, PMI not laterally displaced GI- soft, NT, ND, + BS Extremities- no clubbing, cyanosis, or edema  Wt Readings from Last 3 Encounters:  03/29/20 219 lb 3.2 oz (99.4 kg)  11/12/19 214 lb (97.1 kg)   09/11/19 211 lb 9.6 oz (96 kg)    EKG tracing ordered today is personally reviewed and shows sinus rhythm  Assessment and Plan:  1. Persistent afib Doing very well post ablation off AAD therapy chads2vasc score is 0. Does not require OAC  2. LVH Stable No change required today  3. Snoring We will order a home sleep study  Risks, benefits and potential toxicities for medications prescribed and/or refilled reviewed with patient today.   Return in a year  11/12/19 MD, [facility] 03/29/2020 12:54 PM

## 2020-03-29 NOTE — Telephone Encounter (Signed)
-----   Message from Sampson Goon, RN sent at 03/29/2020  1:54 PM EST ----- Regarding: home sleep study The patient has been ordered a home sleep study for snoring and fatigue.  Thank you  Inetta Fermo

## 2020-04-01 ENCOUNTER — Telehealth: Payer: Self-pay | Admitting: *Deleted

## 2020-04-01 NOTE — Telephone Encounter (Signed)
Staff message sent to [name] ok to schedule HST. No PA is required. 

## 2020-04-22 NOTE — Telephone Encounter (Signed)
  Gaynelle Cage, CMA  Reesa Chew, CMA Patient does not need a PA for HST. Ok to schedule.

## 2020-04-22 NOTE — Telephone Encounter (Signed)
Patient is aware and agreeable to Home Sleep Study through [facility]. Patient is scheduled for 05/14/20 at 12:30 to pick up home sleep kit and meet with Respiratory therapist at [facility]. Patient is aware that if this appointment date and time does not work for them they should contact [facility] directly at [phone]. Patient is aware that a sleep packet will be sent from [facility] in week. Left detailed message on voicemail and informed patient to call back with questions.   

## 2020-05-14 ENCOUNTER — Ambulatory Visit (HOSPITAL_BASED_OUTPATIENT_CLINIC_OR_DEPARTMENT_OTHER): Payer: Managed Care, Other (non HMO)

## 2020-05-14 ENCOUNTER — Other Ambulatory Visit: Payer: Self-pay

## 2020-05-14 DIAGNOSIS — I4819 Other persistent atrial fibrillation: Secondary | ICD-10-CM

## 2020-05-14 DIAGNOSIS — R0683 Snoring: Secondary | ICD-10-CM

## 2020-05-14 DIAGNOSIS — R5383 Other fatigue: Secondary | ICD-10-CM

## 2020-05-19 ENCOUNTER — Other Ambulatory Visit: Payer: Self-pay

## 2020-05-19 ENCOUNTER — Ambulatory Visit (HOSPITAL_BASED_OUTPATIENT_CLINIC_OR_DEPARTMENT_OTHER): Payer: Managed Care, Other (non HMO) | Attending: Internal Medicine | Admitting: Cardiology

## 2020-05-19 DIAGNOSIS — I4819 Other persistent atrial fibrillation: Secondary | ICD-10-CM | POA: Diagnosis not present

## 2020-05-19 DIAGNOSIS — R0683 Snoring: Secondary | ICD-10-CM | POA: Diagnosis not present

## 2020-05-19 DIAGNOSIS — R0902 Hypoxemia: Secondary | ICD-10-CM | POA: Diagnosis not present

## 2020-05-19 DIAGNOSIS — R5383 Other fatigue: Secondary | ICD-10-CM | POA: Diagnosis not present

## 2020-05-19 DIAGNOSIS — G4733 Obstructive sleep apnea (adult) (pediatric): Secondary | ICD-10-CM | POA: Diagnosis not present

## 2020-05-24 NOTE — Procedures (Signed)
    Patient Name: Ronald [name], Ronald [name] Date: 05/19/2020 Gender: Male D.O.B: 07/29/1960 Age (years): 59 Referring Provider: Sherryl Manges Height (inches): 74 Interpreting Physician: Armanda Magic MD, ABSM Weight (lbs): 208 RPSGT: Elaina Pattee BMI: 27 MRN: [id number] Neck Size: 15.00  CLINICAL INFORMATION Sleep Study Type: HST  Indication for sleep study: N/A  Epworth Sleepiness Score: 2  SLEEP STUDY TECHNIQUE A multi-channel overnight portable sleep study was performed. The channels recorded were: nasal airflow, thoracic respiratory movement, and oxygen saturation with a pulse oximetry. Snoring was also monitored.  MEDICATIONS Patient self administered medications include: N/A.  SLEEP ARCHITECTURE Patient was studied for 373.2 minutes. The sleep efficiency was 99.9 % and the patient was supine for 23.2%. The arousal index was 0.0 per hour.  RESPIRATORY PARAMETERS The overall AHI was 20.9 per hour, with a central apnea index of 0 per hour.  The oxygen nadir was 83% during sleep.  CARDIAC DATA Mean heart rate during sleep was 58.8 bpm.  IMPRESSIONS - Moderate obstructive sleep apnea occurred during this study (AHI = 20.9/h). - Moderate oxygen desaturation was noted during this study (Min O2 = 83%). - Patient snored 3.1% during the sleep.  DIAGNOSIS - Obstructive Sleep Apnea (G47.33) - Nocturnal Hypoxemia (G47.36)  RECOMMENDATIONS - Recommend in lab CPAP titration - Positional therapy avoiding supine position during sleep. - Avoid alcohol, sedatives and other CNS depressants that may worsen sleep apnea and disrupt normal sleep architecture. - Sleep hygiene should be reviewed to assess factors that may improve sleep quality. - Weight management and regular exercise should be initiated or continued.  [Electronically signed] 05/24/2020 08:14 AM  Armanda Magic MD, ABSM Diplomate, American Board of Sleep Medicine

## 2020-06-10 ENCOUNTER — Telehealth: Payer: Self-pay | Admitting: *Deleted

## 2020-06-10 NOTE — Telephone Encounter (Signed)
-----   Message from Quintella Reichert, MD sent at 06/01/2020  9:30 PM EDT ----- Please let patient know that they have sleep apnea and recommend CPAP titration. Please set up titration in the sleep lab.

## 2020-06-10 NOTE — Telephone Encounter (Signed)
Informed patient of sleep study results and patient understanding was verbalized. Patient understands his sleep study showed they have sleep apnea and recommend CPAP titration. Please set up titration in the sleep lab.  Pt is aware of her/his results Titration sent to sleep pool.  

## 2020-06-16 ENCOUNTER — Telehealth: Payer: Self-pay | Admitting: *Deleted

## 2020-06-16 DIAGNOSIS — R0683 Snoring: Secondary | ICD-10-CM

## 2020-06-16 DIAGNOSIS — R5383 Other fatigue: Secondary | ICD-10-CM

## 2020-06-16 NOTE — Telephone Encounter (Signed)
Staff message sent to [facility] received CPAP titration denial letter from [facility]). Copy of letter faxed over to her for TT to review.

## 2020-06-22 NOTE — Telephone Encounter (Signed)
Per dr Mayford Knife, order cpap on auto from 4-15 cm H20 with heated humidity mask of [facility] with 6 week follow up Or placed to [facility].  Upon patient request DME selection is [facility] Patient understands he will be contacted by [facility] to set up his cpap. Patient understands to call if [facility] does not contact him with new setup in a timely manner. Patient understands they will be called once confirmation has been received from [facility] that they have received their new machine to schedule 10 week follow up appointment.   [facility] notified of new cpap order  Please add to airview Patient was grateful for the call and thanked me

## 2020-06-22 NOTE — Addendum Note (Signed)
Addended by: Reesa Chew on: 06/22/2020 03:07 PM   Modules accepted: Orders

## 2022-03-14 IMAGING — CT CT HEART MORPH/PULM VEIN W/ CM & W/O CA SCORE
1 series · 12 of 18 positions shown, 15 images · non-contrast
Comparison: None.
COMPARISON: None.

Addendum:
EXAM:
OVER-READ INTERPRETATION  CT CHEST

The following report is an over-read performed by radiologist Dr.
Paola Carolina Leonel [REDACTED] on 08/12/2019. This
over-read does not include interpretation of cardiac or coronary
anatomy or pathology. The coronary calcium score/coronary CTA
interpretation by the cardiologist is attached.
CLINICAL DATA: Atrial fibrillation scheduled for an ablation.
Cardiac CT/CTA
TECHNIQUE: A 120 kV prospective scan was triggered in the ascending thoracic
aorta at 140 HU's. Gantry rotation speed was 250 msecs and
collimation was .6 mm. No beta blockade and no NTG was given. The 3D
data set was reconstructed for best systolic and diastolic phases
along with delayed images of the GUDAURI Images analyzed on a dedicated
work station using MPR, MIP and VRT modes. The patient received 80
cc of contrast.

[Series 461: findings · 0.38mm/px · 12 of 18 slices shown, 15 images]
[im 2/18  vessel]
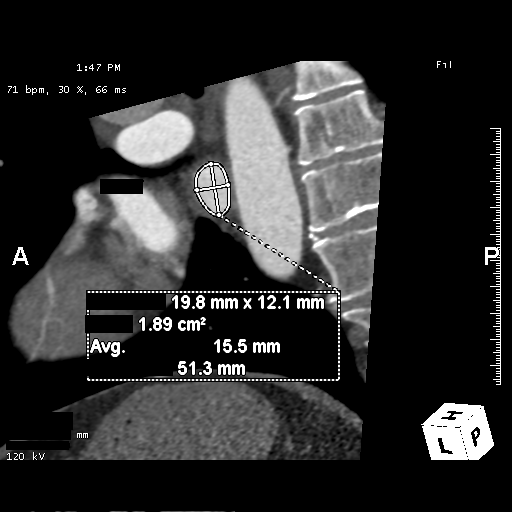
[im 2/18  lung]
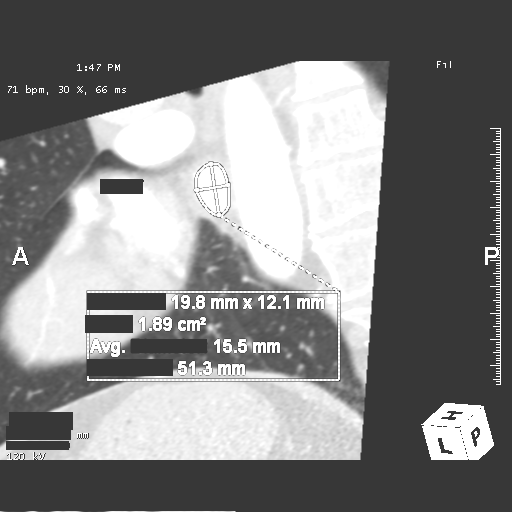
[im 4/18  vessel]
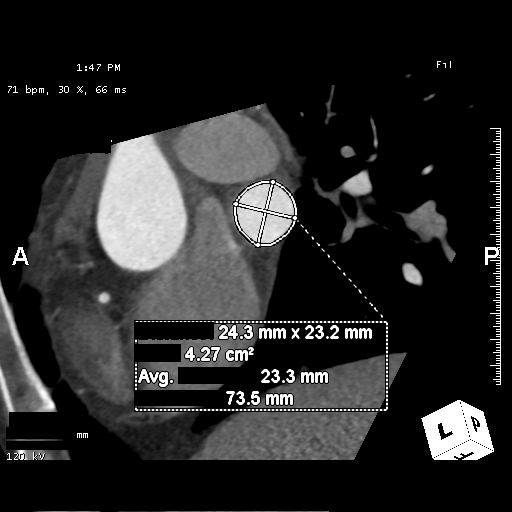
[im 5/18  vessel]
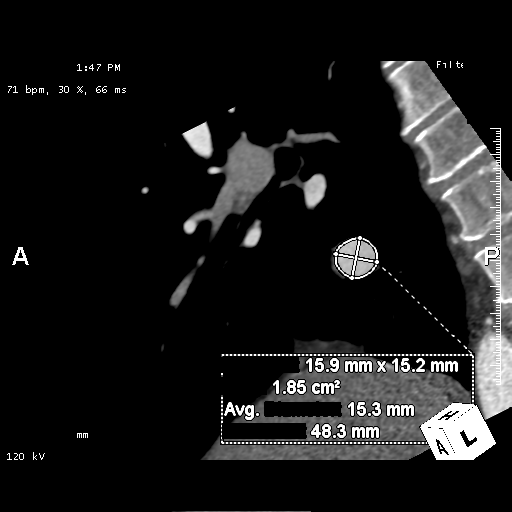
[im 6/18  vessel]
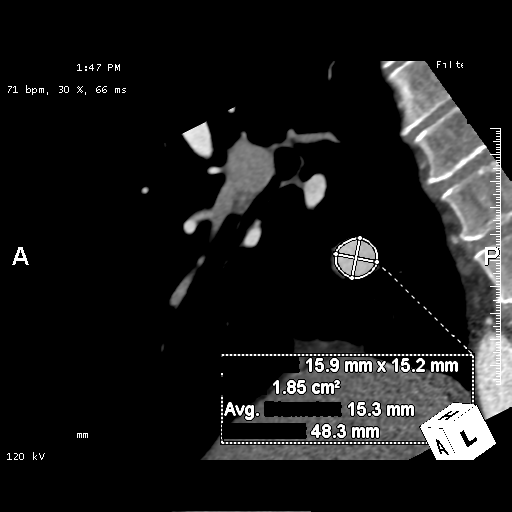
[im 8/18  vessel]
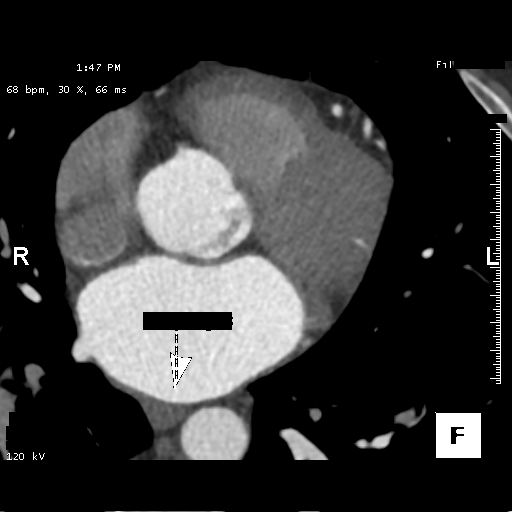
[im 8/18  lung]
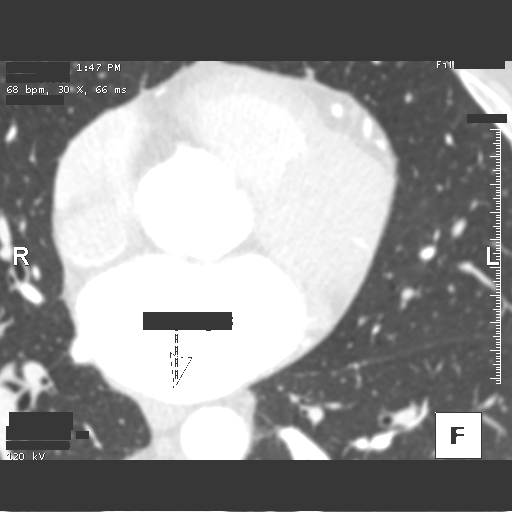
[im 9/18  vessel]
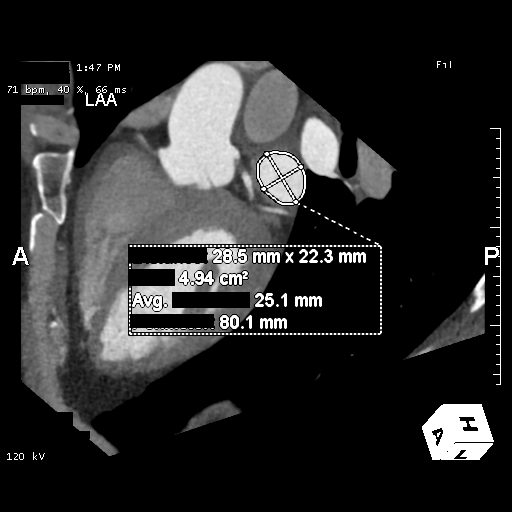
[im 10/18  vessel]
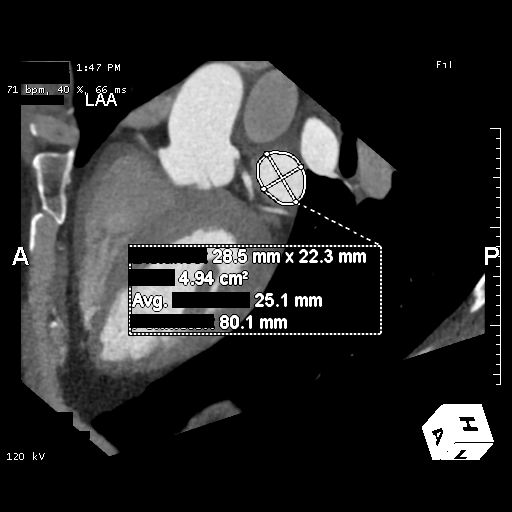
[im 12/18  vessel]
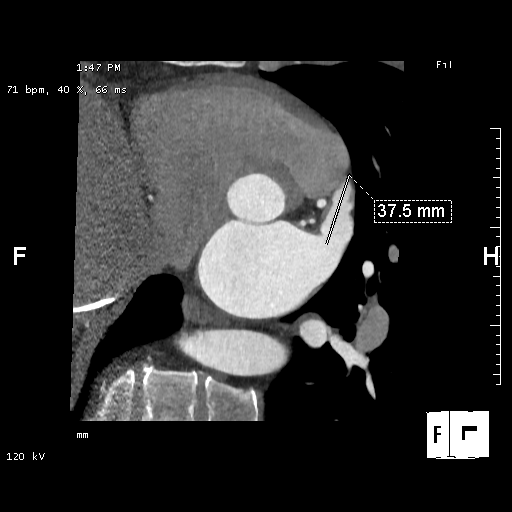
[im 13/18  vessel]
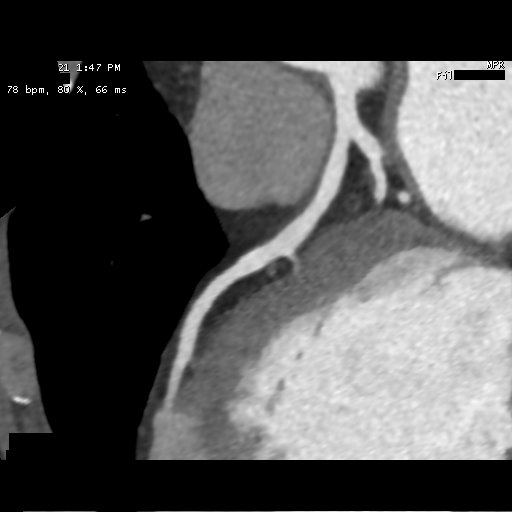
[im 13/18  lung]
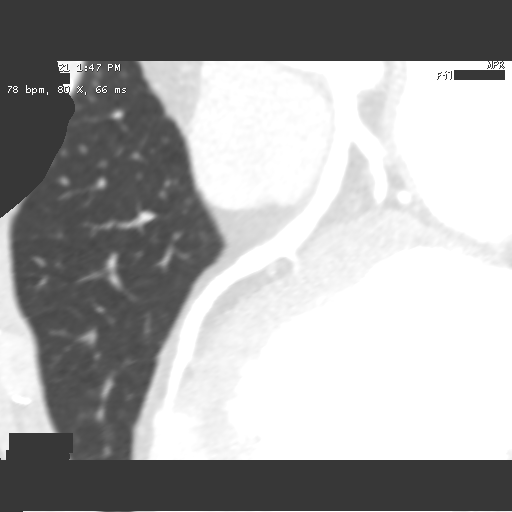
[im 14/18  vessel]
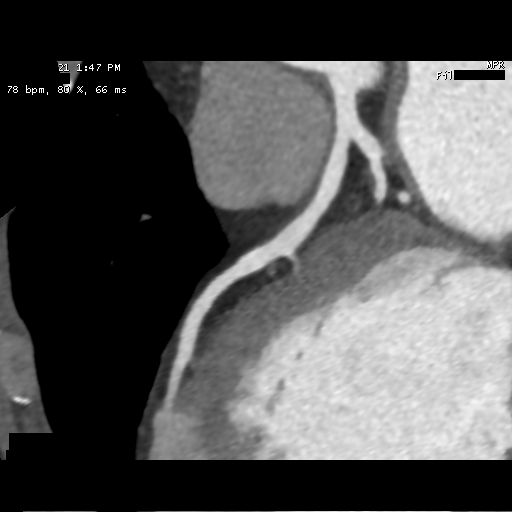
[im 16/18  vessel]
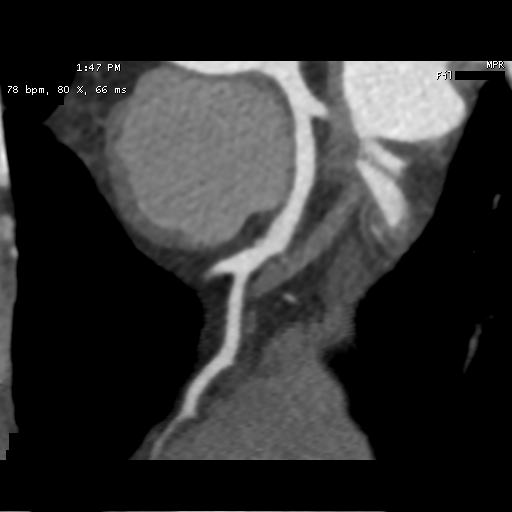
[im 17/18  vessel]
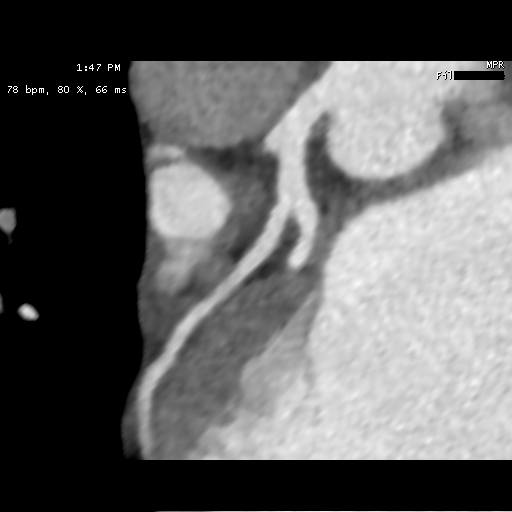

[12 of 18 positions shown; findings below may reference images not displayed]

FINDINGS: Within the visualized portions of the thorax there are no suspicious
appearing pulmonary nodules or masses, there is no acute
consolidative airspace disease, no pleural effusions, no
pneumothorax and no lymphadenopathy. Visualized portions of the
upper abdomen are unremarkable. Well-defined sclerotic lesion with
narrow zone of transition in the posterior aspect of a mid left
thoracic rib, likely to represent a bone island. There are no other
aggressive appearing lytic or blastic lesions noted in the
visualized portions of the skeleton.
IMPRESSION: 1. Probable bone island in a a posterior left-sided rib, as above.
No other sclerotic lesions are identified to suggest metastatic
disease. However, correlation with PSA level could be considered.
FINDINGS: Image quality: excellent.

Noise artifact is: Limited.

Pulmonary Veins: There are 3 right pulmonary veins (right middle
pulmonary vein has separate ostium) and 2 left pulmonary veins.
Ostial measurements as follows:

RUPV: Ostium 24 mm x 23 mm area 4.27 cm2

RMPV: Ostium 12 mm x 9 mm area 0.85 cm2

RLPV:  Ostium 16 mm x 15 mm  area 1.85 cm2

LUPV:  Ostium 24 mm x 20 mm area 3.68 cm2

LLPV:  Ostium 20 mm x 12 mm area 1.89 cm2

Left Atrium: The left atrial size is normal. There is no PFO/ASD.
The left atrial appendage is large - mixed chicken wing / broccoli
type with two lobes and ostial size 29 mm x 22 mm and length 37 mm.
There is no thrombus in the left atrial appendage on contrast or
delayed imaging. The esophagus runs in the left atrial midline and
is not in the proximity to any of the pulmonary veins.

Coronary Arteries: CAC score of 0. Normal coronary origin. Right
dominance. The study was performed without use of NTG and is
insufficient for plaque evaluation. The following assessment was
made of the proximal segments:

Left main: The left main is a large caliber vessel with a normal
take off from the left coronary cusp that bifurcates to form a left
anterior descending artery and a left circumflex artery. There is no
plaque or stenosis.

Left anterior descending artery: The LAD is patent without evidence
of plaque or stenosis in the proximal segment.

Left circumflex artery: The LCX is non-dominant with no evidence of
plaque or stenosis in the proximal segment.

Right coronary artery: The RCA is dominant with normal take off from
the right coronary cusp with no evidence of plaque or stenosis in
the proximal segment.

Right Atrium: Right atrial size is within normal limits.

Right Ventricle: The right ventricular cavity is within normal
limits.

Left Ventricle: The ventricular cavity size is within normal limits.
There are no stigmata of prior infarction. There is no abnormal
filling defect.

Pericardium: Normal thickness with no significant effusion or
calcium present.

Pulmonary Artery: Normal caliber without proximal filling defect.

Cardiac valves: The aortic valve is trileaflet without significant
calcification. The mitral valve is normal structure without
significant calcification.

Aorta: Normal caliber with no significant disease.

Extra-cardiac findings: See attached radiology report for
non-cardiac structures.
IMPRESSION: 1. There is normal pulmonary vein drainage into the left atrium (3
right and 2 left) with ostial measurements above.

2. There is no thrombus in the left atrial appendage.

3. The esophagus runs in the left atrial midline and is not in the
proximity to any of the pulmonary veins.

4. No PFO/ASD.

5. Normal coronary origin. Right dominance.

6. CAC score of 0. No CAD in the proximal segments.

*** End of Addendum ***
EXAM:
OVER-READ INTERPRETATION  CT CHEST

The following report is an over-read performed by radiologist Dr.
Paola Carolina Leonel [REDACTED] on 08/12/2019. This
over-read does not include interpretation of cardiac or coronary
anatomy or pathology. The coronary calcium score/coronary CTA
interpretation by the cardiologist is attached.
FINDINGS: Within the visualized portions of the thorax there are no suspicious
appearing pulmonary nodules or masses, there is no acute
consolidative airspace disease, no pleural effusions, no
pneumothorax and no lymphadenopathy. Visualized portions of the
upper abdomen are unremarkable. Well-defined sclerotic lesion with
narrow zone of transition in the posterior aspect of a mid left
thoracic rib, likely to represent a bone island. There are no other
aggressive appearing lytic or blastic lesions noted in the
visualized portions of the skeleton.
IMPRESSION: 1. Probable bone island in a a posterior left-sided rib, as above.
No other sclerotic lesions are identified to suggest metastatic
disease. However, correlation with PSA level could be considered.

## 2022-05-25 ENCOUNTER — Other Ambulatory Visit: Payer: Self-pay

## 2022-05-25 ENCOUNTER — Encounter (HOSPITAL_BASED_OUTPATIENT_CLINIC_OR_DEPARTMENT_OTHER): Payer: Self-pay

## 2022-05-25 ENCOUNTER — Emergency Department (HOSPITAL_BASED_OUTPATIENT_CLINIC_OR_DEPARTMENT_OTHER): Payer: Managed Care, Other (non HMO)

## 2022-05-25 ENCOUNTER — Emergency Department (HOSPITAL_BASED_OUTPATIENT_CLINIC_OR_DEPARTMENT_OTHER): Payer: Managed Care, Other (non HMO) | Admitting: Radiology

## 2022-05-25 ENCOUNTER — Emergency Department (HOSPITAL_BASED_OUTPATIENT_CLINIC_OR_DEPARTMENT_OTHER)
Admission: EM | Admit: 2022-05-25 | Discharge: 2022-05-25 | Disposition: A | Discharge status: Home / Self Care | Payer: Managed Care, Other (non HMO) | Attending: Emergency Medicine | Admitting: Emergency Medicine

## 2022-05-25 DIAGNOSIS — R519 Headache, unspecified: Secondary | ICD-10-CM | POA: Insufficient documentation

## 2022-05-25 DIAGNOSIS — W108XXA Fall (on) (from) other stairs and steps, initial encounter: Secondary | ICD-10-CM | POA: Insufficient documentation

## 2022-05-25 DIAGNOSIS — M79652 Pain in left thigh: Secondary | ICD-10-CM | POA: Diagnosis not present

## 2022-05-25 DIAGNOSIS — T148XXA Other injury of unspecified body region, initial encounter: Secondary | ICD-10-CM

## 2022-05-25 DIAGNOSIS — S80212A Abrasion, left knee, initial encounter: Secondary | ICD-10-CM | POA: Insufficient documentation

## 2022-05-25 DIAGNOSIS — M791 Myalgia, unspecified site: Secondary | ICD-10-CM

## 2022-05-25 DIAGNOSIS — R42 Dizziness and giddiness: Secondary | ICD-10-CM | POA: Diagnosis not present

## 2022-05-25 DIAGNOSIS — M25512 Pain in left shoulder: Secondary | ICD-10-CM

## 2022-05-25 DIAGNOSIS — S40212A Abrasion of left shoulder, initial encounter: Secondary | ICD-10-CM | POA: Insufficient documentation

## 2022-05-25 DIAGNOSIS — W19XXXA Unspecified fall, initial encounter: Secondary | ICD-10-CM

## 2022-05-25 DIAGNOSIS — M5030 Other cervical disc degeneration, unspecified cervical region: Secondary | ICD-10-CM

## 2022-05-25 DIAGNOSIS — S4992XA Unspecified injury of left shoulder and upper arm, initial encounter: Secondary | ICD-10-CM | POA: Diagnosis present
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
## 2022-05-25 NOTE — Discharge Instructions (Addendum)
You may wear a splint for comfort, perform range of motion exercises with your left shoulder and follow up with your primary care doctor. If you are having continued pain can consider repeat XR or follow up with sports medicine/orthopedics.

## 2022-05-25 NOTE — ED Triage Notes (Signed)
Patient here POV from Home.  Endorses falling down at least 10 Steps at 1915. No LOC. Head Injury against wall. Pain to left Thigh, Left Shoulder, Posterior head. No Anticoagulants.   NAD Noted during Triage. A&Ox4. Gcs 15. Ambulatory.

## 2022-05-25 NOTE — ED Notes (Signed)
Patient transported to X-ray 

## 2022-11-01 ENCOUNTER — Encounter (HOSPITAL_COMMUNITY): Payer: Self-pay

## 2022-11-01 ENCOUNTER — Other Ambulatory Visit: Payer: Self-pay

## 2022-11-01 ENCOUNTER — Ambulatory Visit (HOSPITAL_BASED_OUTPATIENT_CLINIC_OR_DEPARTMENT_OTHER)
Admission: RE | Admit: 2022-11-01 | Discharge: 2022-11-01 | Disposition: A | Discharge status: Other Acute Inpatient Hospital | Payer: Managed Care, Other (non HMO) | Source: Ambulatory Visit | Attending: Internal Medicine | Admitting: Internal Medicine

## 2022-11-01 ENCOUNTER — Emergency Department (HOSPITAL_COMMUNITY)
Admission: EM | Admit: 2022-11-01 | Discharge: 2022-11-01 | Disposition: A | Discharge status: Home / Self Care | Payer: Managed Care, Other (non HMO) | Attending: Emergency Medicine | Admitting: Emergency Medicine

## 2022-11-01 ENCOUNTER — Emergency Department (HOSPITAL_COMMUNITY): Payer: Managed Care, Other (non HMO)

## 2022-11-01 VITALS — BP 80/70 | HR 150 | Ht 73.0 in | Wt 208.0 lb

## 2022-11-01 DIAGNOSIS — I4819 Other persistent atrial fibrillation: Secondary | ICD-10-CM | POA: Insufficient documentation

## 2022-11-01 DIAGNOSIS — R9431 Abnormal electrocardiogram [ECG] [EKG]: Secondary | ICD-10-CM | POA: Insufficient documentation

## 2022-11-01 DIAGNOSIS — I4892 Unspecified atrial flutter: Secondary | ICD-10-CM | POA: Insufficient documentation

## 2022-11-01 DIAGNOSIS — I959 Hypotension, unspecified: Secondary | ICD-10-CM | POA: Insufficient documentation

## 2022-11-01 DIAGNOSIS — Z7901 Long term (current) use of anticoagulants: Secondary | ICD-10-CM | POA: Diagnosis not present

## 2022-11-01 DIAGNOSIS — I4891 Unspecified atrial fibrillation: Secondary | ICD-10-CM | POA: Diagnosis not present

## 2022-11-01 LAB — CBC WITH DIFFERENTIAL/PLATELET
Abs Immature Granulocytes: 0.01 10*3/uL (ref 0.00–0.07)
Basophils Absolute: 0 10*3/uL (ref 0.0–0.1)
Basophils Relative: 1 %
Eosinophils Absolute: 0 10*3/uL (ref 0.0–0.5)
Eosinophils Relative: 1 %
HCT: 46.7 % (ref 39.0–52.0)
Hemoglobin: 15.9 g/dL (ref 13.0–17.0)
Immature Granulocytes: 0 %
Lymphocytes Relative: 29 %
Lymphs Abs: 1.1 10*3/uL (ref 0.7–4.0)
MCH: 30.9 pg (ref 26.0–34.0)
MCHC: 34 g/dL (ref 30.0–36.0)
MCV: 90.9 fL (ref 80.0–100.0)
Monocytes Absolute: 0.4 10*3/uL (ref 0.1–1.0)
Monocytes Relative: 10 %
Neutro Abs: 2.3 10*3/uL (ref 1.7–7.7)
Neutrophils Relative %: 59 %
Platelets: 203 10*3/uL (ref 150–400)
RBC: 5.14 MIL/uL (ref 4.22–5.81)
RDW: 12.5 % (ref 11.5–15.5)
WBC: 3.9 10*3/uL — ABNORMAL LOW (ref 4.0–10.5)
nRBC: 0 % (ref 0.0–0.2)

## 2022-11-01 LAB — TSH: TSH: 0.888 u[IU]/mL (ref 0.350–4.500)

## 2022-11-01 LAB — BASIC METABOLIC PANEL
Anion gap: 12 (ref 5–15)
BUN: 10 mg/dL (ref 6–20)
CO2: 24 mmol/L (ref 22–32)
Calcium: 9.3 mg/dL (ref 8.9–10.3)
Chloride: 103 mmol/L (ref 98–111)
Creatinine, Ser: 1.07 mg/dL (ref 0.61–1.24)
GFR, Estimated: >60 mL/min (ref >60)
Glucose, Bld: 110 mg/dL — ABNORMAL HIGH (ref 70–99)
Potassium: 4.2 mmol/L (ref 3.5–5.1)
Sodium: 139 mmol/L (ref 135–145)

## 2022-11-01 LAB — MAGNESIUM: Magnesium: 2.3 mg/dL (ref 1.7–2.4)

## 2022-11-01 LAB — T4, FREE: Free T4: 0.81 ng/dL (ref 0.61–1.12)

## 2022-11-01 MED ORDER — METOPROLOL TARTRATE 50 MG PO TABS: 50.0000 mg | ORAL_TABLET | Freq: Two times a day (BID) | ORAL | 0 refills | Status: AC

## 2022-11-01 MED ORDER — SODIUM CHLORIDE 0.9 % IV BOLUS
1000.0000 mL | Freq: Once | INTRAVENOUS | Status: AC
  Administered 2022-11-01: 1000 mL via INTRAVENOUS

## 2022-11-01 MED ORDER — METOPROLOL TARTRATE 5 MG/5ML IV SOLN
5.0000 mg | Freq: Once | INTRAVENOUS | Status: AC
  Administered 2022-11-01: 5 mg via INTRAVENOUS
  Filled 2022-11-01: qty 5

## 2022-11-01 MED ORDER — APIXABAN 5 MG PO TABS: 5.0000 mg | ORAL_TABLET | Freq: Two times a day (BID) | ORAL | 0 refills | Status: AC

## 2022-11-01 NOTE — ED Provider Notes (Signed)
[place] EMERGENCY DEPARTMENT AT [facility] Provider Note   CSN: [id number] Arrival date & time: 11/01/22  0865     History  Chief Complaint  Patient presents with   Atrial Fibrillation    Ronald [name] is a 62 y.o. male.  62 yo M with a cc of feeling like his heart is beating like he is running fast.  The patient has had symptoms off an on for the past week.  Tends to occur when he gets up to go to the bathroom early in the morning.  Typically resolves in about 40 min or so.  No issues exercising, biked 30+ miles last night.   Went to cardiology clinic today HR in 150's sent here for eval.  Hx of a fib, thought to be initially due to hyperthyoidism, is s/p ablation.  Has not had an event since the ablation.          Home Medications Prior to Admission medications   Medication Sig Start Date End Date Taking? Authorizing Provider  apixaban (ELIQUIS) 5 MG TABS tablet Take 1 tablet (5 mg total) by mouth 2 (two) times daily. 11/01/22 12/01/22 Yes [name], DO  metoprolol tartrate (LOPRESSOR) 50 MG tablet Take 1 tablet (50 mg total) by mouth 2 (two) times daily. 11/01/22  Yes [name], DO      Allergies    Ciprofloxacin    Review of Systems   Review of Systems  Physical Exam Updated Vital Signs BP 113/85   Pulse 60   Temp 98 F (36.7 C) (Oral)   Resp 13   Ht 6\' 1"  (1.854 m)   Wt 94.3 kg   SpO2 100%   BMI 27.44 kg/m  Physical Exam Vitals and nursing note reviewed.  Constitutional:      Appearance: He is well-developed.  HENT:     Head: Normocephalic and atraumatic.  Eyes:     Pupils: Pupils are equal, round, and reactive to light.  Neck:     Vascular: No JVD.  Cardiovascular:     Rate and Rhythm: Tachycardia present. Rhythm irregular.     Heart sounds: No murmur heard.    No friction rub. No gallop.  Pulmonary:     Effort: No respiratory distress.     Breath sounds: No wheezing.  Abdominal:     General: There is no distension.      Tenderness: There is no abdominal tenderness. There is no guarding or rebound.  Musculoskeletal:        General: Normal range of motion.     Cervical back: Normal range of motion and neck supple.  Skin:    Coloration: Skin is not pale.     Findings: No rash.  Neurological:     Mental Status: He is alert and oriented to person, place, and time.  Psychiatric:        Behavior: Behavior normal.     ED Results / Procedures / Treatments   Labs (all labs ordered are listed, but only abnormal results are displayed) Labs Reviewed  CBC WITH DIFFERENTIAL/PLATELET - Abnormal; Notable for the following components:      Result Value   WBC 3.9 (*)    All other components within normal limits  BASIC METABOLIC PANEL - Abnormal; Notable for the following components:   Glucose, Bld 110 (*)    All other components within normal limits  MAGNESIUM  TSH  T4, FREE    EKG EKG Interpretation Date/Time:  Wednesday November 01 2022  11:11:45 EDT Ventricular Rate:  65 PR Interval:  179 QRS Duration:  101 QT Interval:  430 QTC Calculation: 448 R Axis:   41  Text Interpretation: Normal sinus rhythm Otherwise no significant change Confirmed by [name] [phone]) on 11/01/2022 3:19:26 PM  Radiology DG Chest Port 1 View  Result Date: 11/01/2022 CLINICAL DATA:  Tachycardia. EXAM: PORTABLE CHEST 1 VIEW COMPARISON:  Cardiac CT dated August 12, 2019. FINDINGS: The heart size and mediastinal contours are within normal limits. Both lungs are clear. The visualized skeletal structures are unremarkable. IMPRESSION: No active disease. Electronically Signed   By: Obie Dredge M.D.   On: 11/01/2022 10:18    Procedures Procedures    Medications Ordered in ED Medications  sodium chloride 0.9 % bolus 1,000 mL (0 mLs Intravenous Stopped 11/01/22 1135)  metoprolol tartrate (LOPRESSOR) injection 5 mg (5 mg Intravenous Given 11/01/22 0940)    ED Course/ Medical Decision Making/ A&P                                  Medical Decision Making Amount and/or Complexity of Data Reviewed Labs: ordered. Radiology: ordered. ECG/medicine tests: ordered.  Risk Prescription drug management.   62 yo M with a cc of his heart racing like he is running.  Patient with hx of afib but no events since his ablation.    Symptoms off and on for past week.  In likely flutter with 2:1 block.  Bolus of fluids, labs, cxr.  Metop.  Reassess.   Patient has converted spontaneously.  No significant electrolyte abnormalities no anemia chest x-ray independently interpreted by me without focal infiltrate.  CHA2DS2/VAS Stroke Risk Points  Current as of 2 hours ago     1 >= 2 Points: High Risk  1 to 1.99 Points: Medium Risk  0 Points: Low Risk    Last Change: N/A      Details    This score determines the patient's risk of having a stroke if the  patient has atrial fibrillation.       Points Metrics  0 Has Congestive Heart Failure:  No    Current as of 2 hours ago  0 Has Vascular Disease:  No    Current as of 2 hours ago  1 Has Hypertension:  Yes     Current as of 2 hours ago  0 Age:  27    Current as of 2 hours ago  0 Has Diabetes:  No    Current as of 2 hours ago  0 Had Stroke:  No  Had TIA:  No  Had Thromboembolism:  No    Current as of 2 hours ago  0 Male:  No    Current as of 2 hours ago     3:19 PM:  I have discussed the diagnosis/risks/treatment options with the patient.  Evaluation and diagnostic testing in the emergency department does not suggest an emergent condition requiring admission or immediate intervention beyond what has been performed at this time.  They will follow up with PCP. We also discussed returning to the ED immediately if new or worsening sx occur. We discussed the sx which are most concerning (e.g., sudden worsening pain, fever, inability to tolerate by mouth) that necessitate immediate return. Medications administered to the patient during their visit and any new prescriptions provided to  the patient are listed below.  Medications given during this visit Medications  sodium chloride  0.9 % bolus 1,000 mL (0 mLs Intravenous Stopped 11/01/22 1135)  metoprolol tartrate (LOPRESSOR) injection 5 mg (5 mg Intravenous Given 11/01/22 0940)     The patient appears reasonably screen and/or stabilized for discharge and I doubt any other medical condition or other [facility] requiring further screening, evaluation, or treatment in the ED at this time prior to discharge.               Final Clinical Impression(s) / ED Diagnoses Final diagnoses:  Atrial flutter by electrocardiogram [facility])    Rx / DC Orders ED Discharge Orders          Ordered    apixaban (ELIQUIS) 5 MG TABS tablet  2 times daily        11/01/22 1117    metoprolol tartrate (LOPRESSOR) 50 MG tablet  2 times daily        11/01/22 1117    Ambulatory referral to Cardiology       Comments: If you have not heard from the Cardiology office within the next 72 hours please call [phone].   11/01/22 1119              [name], DO 11/01/22 1519

## 2022-11-01 NOTE — ED Triage Notes (Signed)
PT came in after been seen in the AFIB clinic, his heart rate was 150 BPM and they advised him to be seen in the ED.

## 2022-11-01 NOTE — Discharge Instructions (Signed)
Technically you are at increased risk for stroke based on your history of having high blood pressure.  I have placed the order for a prescription for both Eliquis as well as metoprolol.  Please call the A-fib clinic and let them know about your visit here see when they will see you in the office.  I have also tried to place a referral with the electrophysiology office.  I think Dr. Johney Frame may have retired.

## 2022-11-01 NOTE — Progress Notes (Signed)
Primary Care Physician: Patient, No Pcp Per Primary Cardiologist: None Electrophysiologist: formerly Dr. Johney Frame    Referring Physician:      [name] is a 62 y.o. male with a history of ablation in 2021 who presents for consultation in the [facility]. He arrived today as a walk-in noting rapid heart rate. Last seen by Dr. Johney Frame in 2022. Patient is not on anticoagulation. He has a CHADS2VASC score of zero.  On follow up today, he is in atrial flutter with RVR. He feels SOB and easily fatigued. He feels as though it has been intermittent this past week; no rhythm monitoring device. He is active and exercises routinely. He drinks decaf coffee, has possibly 1-2 beers while playing golf weekly, and does not snore. His father and sister have had ablations for Afib and his brother has Afib with pill in pocket strategy. He is not on anticoagulation or rate control at this time.   Today, he denies symptoms of chest pain, orthopnea, PND, lower extremity edema, dizziness, presyncope, syncope, snoring, daytime somnolence, bleeding, or neurologic sequela. The patient is tolerating medications without difficulties and is otherwise without complaint today.    Atrial Fibrillation Risk Factors:  he does not have symptoms or diagnosis of sleep apnea.  he has a BMI of Body mass index is 27.44 kg/m.Marland Kitchen Filed Weights   11/01/22 0838  Weight: 94.3 kg    Current Outpatient Medications  Medication Sig Dispense Refill   Multiple Vitamins-Minerals (MULTIVITAMIN WITH MINERALS) tablet Take 1 tablet by mouth daily.     No current facility-administered medications for this encounter.    Atrial Fibrillation Management history:  Previous antiarrhythmic drugs: None Previous cardioversions: 2021 Previous ablations: 2021 Anticoagulation history: None   ROS- All systems are reviewed and negative except as per the HPI above.  Physical Exam: BP (!) 80/70   Pulse (!)  150   Ht 6\' 1"  (1.854 m)   Wt 94.3 kg   BMI 27.44 kg/m   GEN: Well nourished, well developed in no acute distress NECK: No JVD; No carotid bruits CARDIAC: Regular tachycardic rate and rhythm, no murmurs, rubs, gallops RESPIRATORY:  Clear to auscultation without rales, wheezing or rhonchi  ABDOMEN: Soft, non-tender, non-distended EXTREMITIES:  No edema; No deformity   EKG today demonstrates  Vent. rate 150 BPM PR interval * ms QRS duration 78 ms QT/QTcB 296/467 ms P-R-T axes * 1 45 Atrial flutter with 2:1 A-V conduction Cannot rule out Inferior infarct , age undetermined Abnormal ECG When compared with ECG of 11-Sep-2019 09:59, PREVIOUS ECG IS PRESENT  ASSESSMENT & PLAN CHA2DS2-VASc Score = 0  The patient's score is based upon: CHF History: 0 HTN History: 0 Diabetes History: 0 Stroke History: 0 Vascular Disease History: 0 Age Score: 0 Gender Score: 0       ASSESSMENT AND PLAN: Persistent Atrial Fibrillation (ICD10:  I48.19) / Atrial flutter The patient's CHA2DS2-VASc score is 0, indicating a 0.2% annual risk of stroke.    He is currently in Afib with RVR. He is hypotensive.  Discussion with patient that at this time limited options to be able to treat his arrhythmia in a clinic setting given his hypotension. Recommendation to go to ED for urgent treatment. He is not on anticoagulation - states current arrhythmia began this morning. He agrees to seek ED treatment given symptoms, rapid HR, and low blood pressure.     Patient transported down to ED via wheelchair.    Lake Bells, PA-C  Afib Clinic [facility] [address] [place], [place] [address] [phone]

## 2022-11-08 ENCOUNTER — Ambulatory Visit (HOSPITAL_COMMUNITY): Payer: Managed Care, Other (non HMO) | Admitting: Internal Medicine
